# Patient Record
Sex: Female | Born: 1965 | Race: White | Hispanic: Yes | Marital: Single | Smoking: Never smoker
Health system: Southern US, Community
[De-identification: ages and names within clinical notes are randomized; demographics above are authoritative.]

## PROBLEM LIST (undated history)

## (undated) DIAGNOSIS — Z8719 Personal history of other diseases of the digestive system: Secondary | ICD-10-CM

## (undated) DIAGNOSIS — K8050 Calculus of bile duct without cholangitis or cholecystitis without obstruction: Secondary | ICD-10-CM

## (undated) DIAGNOSIS — R600 Localized edema: Secondary | ICD-10-CM

## (undated) HISTORY — PX: OTHER SURGICAL HISTORY: SHX169

## (undated) HISTORY — DX: Calculus of bile duct without cholangitis or cholecystitis without obstruction: K80.50

---

## 2004-11-11 ENCOUNTER — Inpatient Hospital Stay: Payer: Self-pay | Admitting: Obstetrics and Gynecology

## 2005-06-22 ENCOUNTER — Other Ambulatory Visit: Payer: Self-pay

## 2005-06-22 ENCOUNTER — Emergency Department: Payer: Self-pay | Admitting: Emergency Medicine

## 2005-06-25 ENCOUNTER — Ambulatory Visit: Payer: Self-pay | Admitting: Emergency Medicine

## 2006-11-28 ENCOUNTER — Ambulatory Visit (HOSPITAL_COMMUNITY): Admission: RE | Admit: 2006-11-28 | Discharge: 2006-11-28 | Payer: Self-pay | Admitting: Obstetrics & Gynecology

## 2007-01-19 ENCOUNTER — Ambulatory Visit: Payer: Self-pay | Admitting: *Deleted

## 2007-01-19 ENCOUNTER — Inpatient Hospital Stay (HOSPITAL_COMMUNITY): Admission: AD | Admit: 2007-01-19 | Discharge: 2007-01-21 | Payer: Self-pay | Admitting: Obstetrics & Gynecology

## 2008-06-11 IMAGING — US US OB COMP +14 WK
1 series · 14 of 28 positions shown · non-contrast
Comparison: none

OBSTETRICAL ULTRASOUND:

 This ultrasound exam was performed in the [HOSPITAL] Ultrasound Department.  The OB US report was generated in the AS system, and faxed to the ordering physician.  This report is also available in [REDACTED] PACS.

[Series 1: us ob comp +14 wk · 0.30mm/px · 14 of 46 slices shown]
[im 2/46]
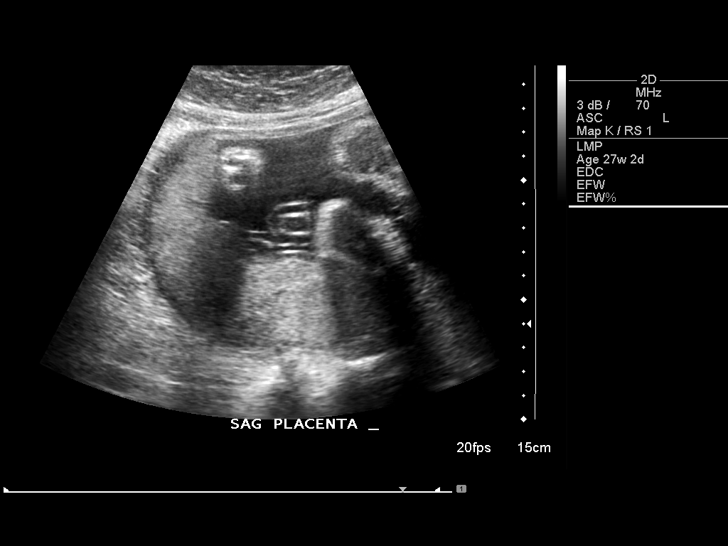
[im 6/46]
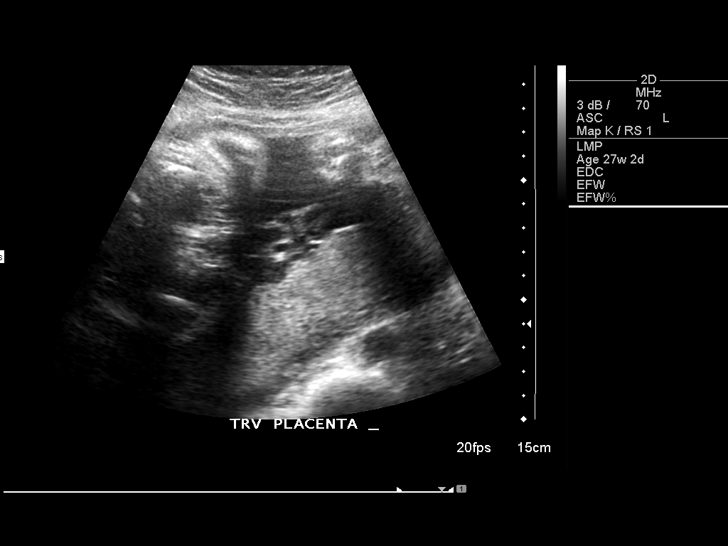
[im 9/46]
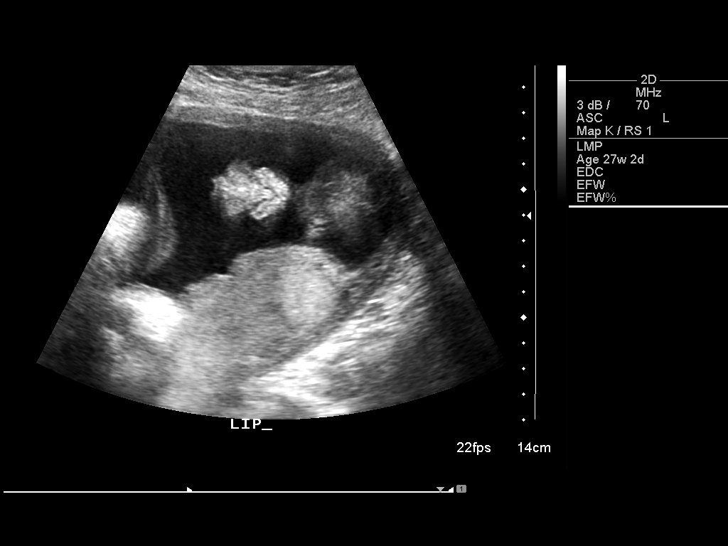
[im 12/46]
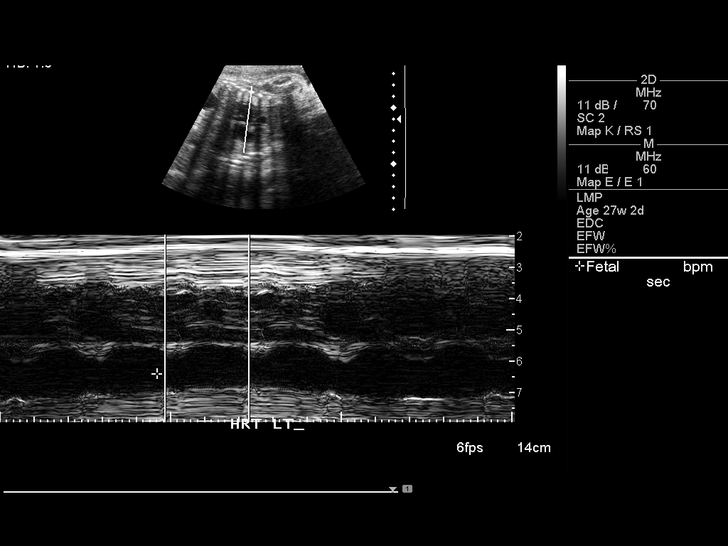
[im 16/46]
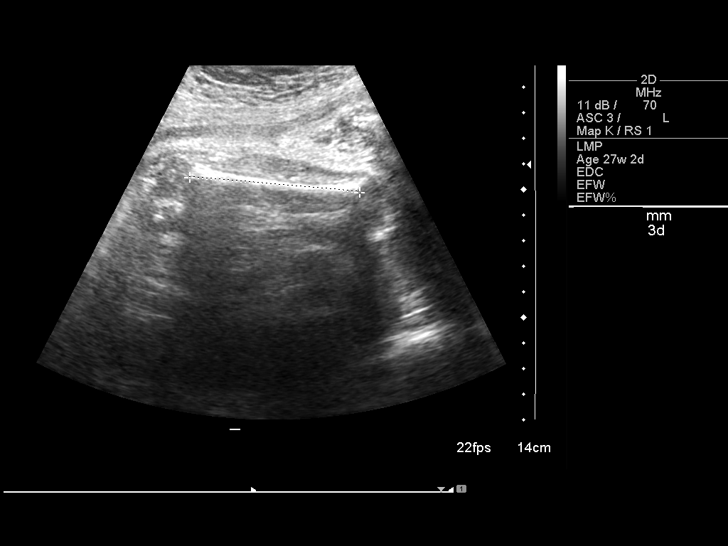
[im 19/46]
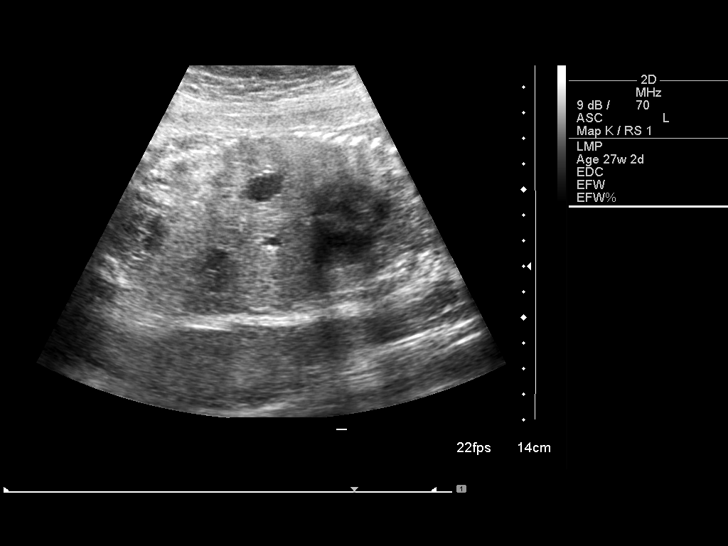
[im 22/46]
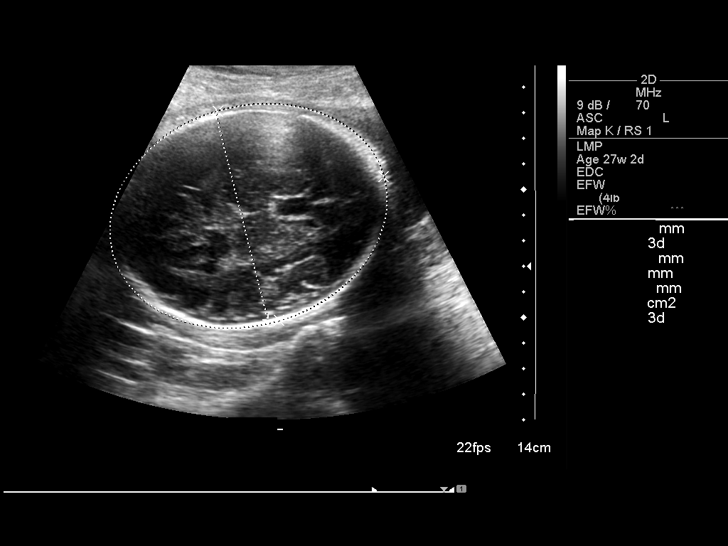
[im 26/46]
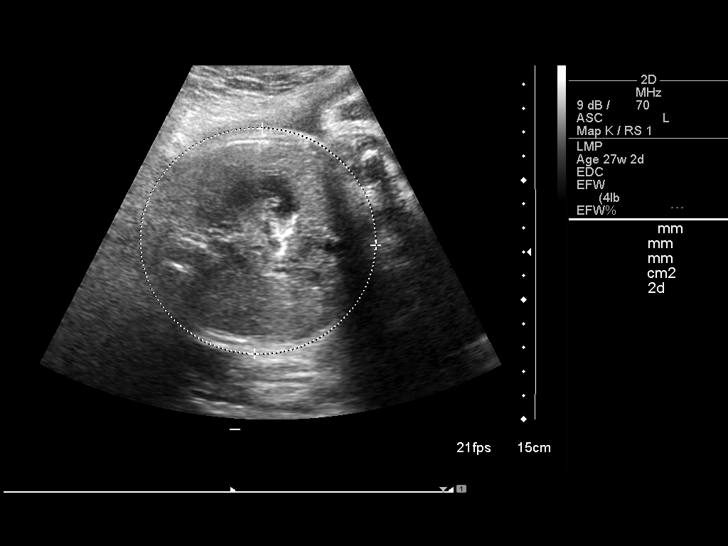
[im 29/46]
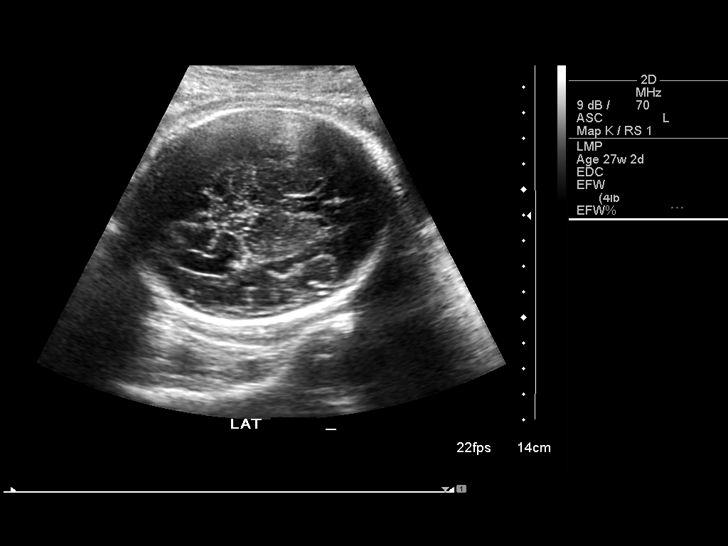
[im 32/46]
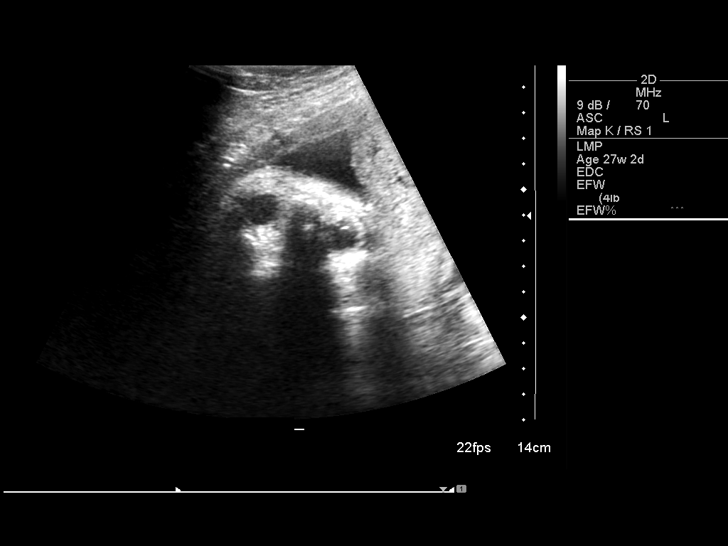
[im 36/46]
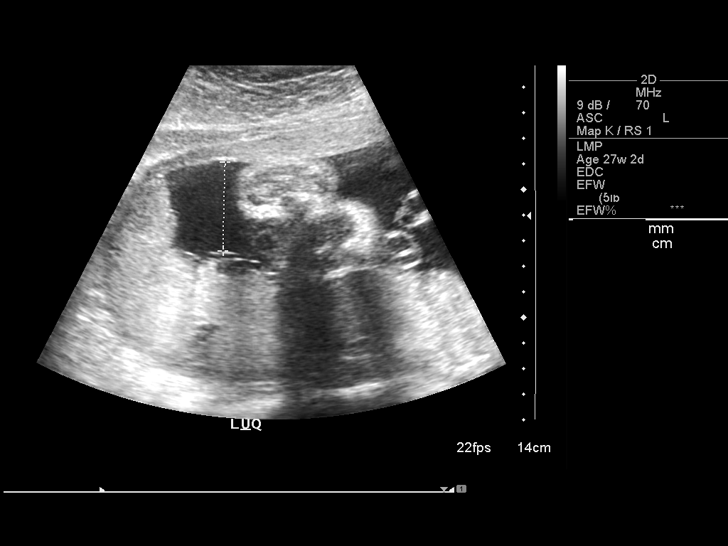
[im 39/46]
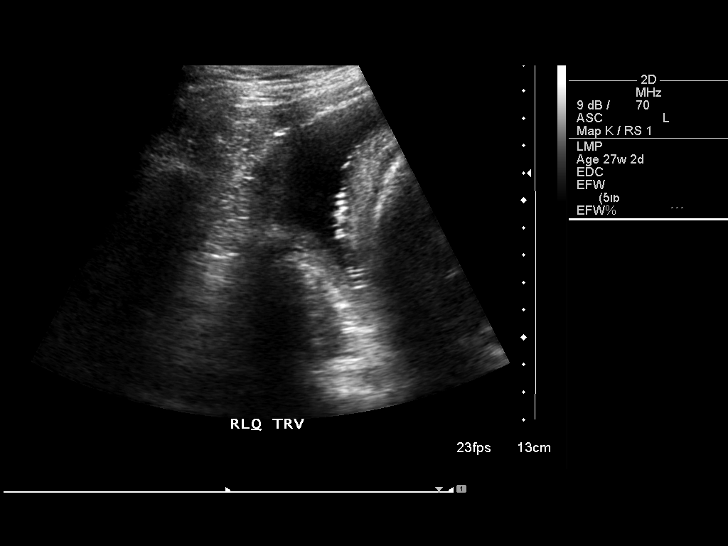
[im 42/46]
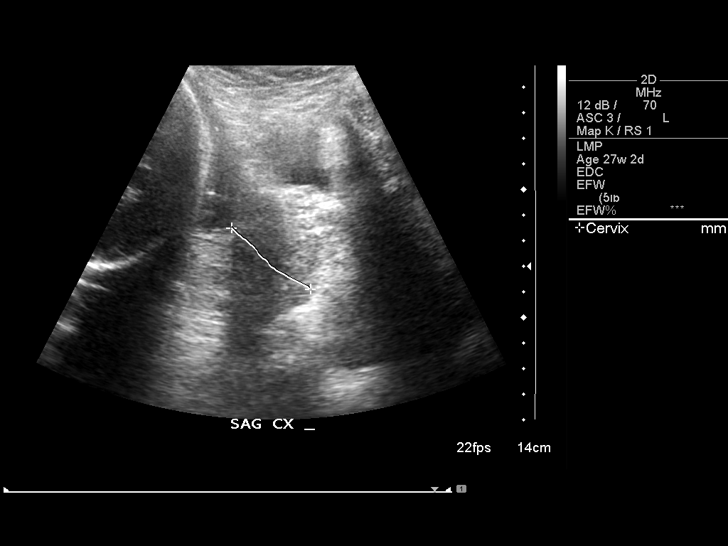
[im 46/46]
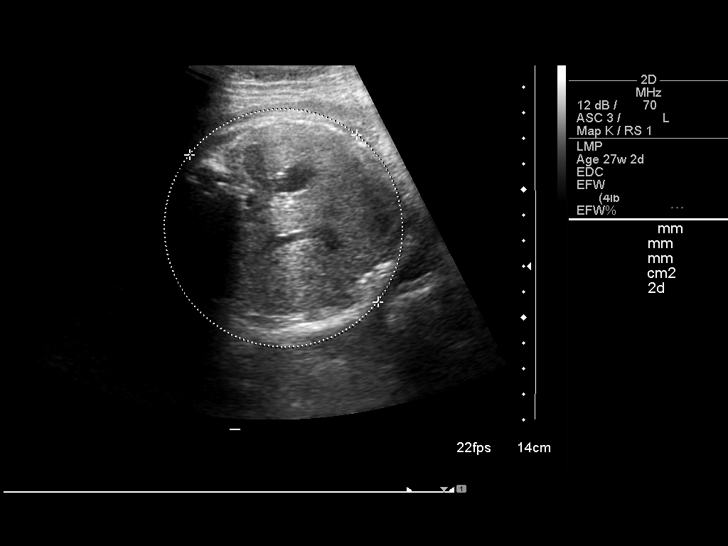

[14 of 28 positions shown; findings below may reference images not displayed]

IMPRESSION: See AS Obstetric US report.

## 2009-11-08 ENCOUNTER — Encounter: Admission: RE | Admit: 2009-11-08 | Discharge: 2009-11-08 | Payer: Self-pay | Admitting: Specialist

## 2011-05-30 LAB — CBC
Hemoglobin: 10.5 — ABNORMAL LOW
MCHC: 33.5
MCV: 90.7
MCV: 91.4
Platelets: 261
RDW: 14.6 — ABNORMAL HIGH
WBC: 10.1

## 2011-05-30 LAB — RPR: RPR Ser Ql: NONREACTIVE

## 2011-07-22 ENCOUNTER — Emergency Department (HOSPITAL_COMMUNITY)
Admission: EM | Admit: 2011-07-22 | Discharge: 2011-07-22 | Disposition: A | Discharge status: Home / Self Care | Payer: Self-pay | Attending: Emergency Medicine | Admitting: Emergency Medicine

## 2011-07-22 ENCOUNTER — Emergency Department (HOSPITAL_COMMUNITY): Payer: Self-pay

## 2011-07-22 ENCOUNTER — Encounter: Payer: Self-pay | Admitting: *Deleted

## 2011-07-22 DIAGNOSIS — R10816 Epigastric abdominal tenderness: Secondary | ICD-10-CM | POA: Insufficient documentation

## 2011-07-22 DIAGNOSIS — R1013 Epigastric pain: Secondary | ICD-10-CM | POA: Insufficient documentation

## 2011-07-22 DIAGNOSIS — K8020 Calculus of gallbladder without cholecystitis without obstruction: Secondary | ICD-10-CM | POA: Insufficient documentation

## 2011-07-22 DIAGNOSIS — K8050 Calculus of bile duct without cholangitis or cholecystitis without obstruction: Secondary | ICD-10-CM

## 2011-07-22 LAB — COMPREHENSIVE METABOLIC PANEL
ALT: 37 U/L — ABNORMAL HIGH (ref 0–35)
AST: 70 U/L — ABNORMAL HIGH (ref 0–37)
Albumin: 3 g/dL — ABNORMAL LOW (ref 3.5–5.2)
Alkaline Phosphatase: 89 U/L (ref 39–117)
BUN: 11 mg/dL (ref 6–23)
Calcium: 9.3 mg/dL (ref 8.4–10.5)
Creatinine, Ser: 0.56 mg/dL (ref 0.50–1.10)
GFR calc Af Amer: >90 mL/min (ref >90)
GFR calc non Af Amer: >90 mL/min (ref >90)
Glucose, Bld: 164 mg/dL — ABNORMAL HIGH (ref 70–99)
Total Protein: 6.9 g/dL (ref 6.0–8.3)

## 2011-07-22 LAB — URINALYSIS, ROUTINE W REFLEX MICROSCOPIC
Glucose, UA: NEGATIVE mg/dL
Ketones, ur: NEGATIVE mg/dL
Nitrite: NEGATIVE
Protein, ur: NEGATIVE mg/dL
Specific Gravity, Urine: 1.025 (ref 1.005–1.030)
Urobilinogen, UA: 1 mg/dL (ref 0.0–1.0)
pH: 5.5 (ref 5.0–8.0)

## 2011-07-22 LAB — DIFFERENTIAL
Neutro Abs: 6.8 10*3/uL (ref 1.7–7.7)
Neutrophils Relative %: 78 % — ABNORMAL HIGH (ref 43–77)

## 2011-07-22 LAB — CBC
MCH: 29.4 pg (ref 26.0–34.0)
Platelets: 210 10*3/uL (ref 150–400)
RBC: 4.15 MIL/uL (ref 3.87–5.11)
RDW: 13.1 % (ref 11.5–15.5)
WBC: 8.7 10*3/uL (ref 4.0–10.5)

## 2011-07-22 LAB — URINE MICROSCOPIC-ADD ON

## 2011-07-22 MED ORDER — ONDANSETRON HCL 4 MG PO TABS: 4.0000 mg | ORAL_TABLET | Freq: Four times a day (QID) | ORAL | Status: AC

## 2011-07-22 MED ORDER — OXYCODONE-ACETAMINOPHEN 5-325 MG PO TABS: 2.0000 | ORAL_TABLET | ORAL | Status: AC | PRN

## 2011-07-22 NOTE — ED Notes (Signed)
Patient transported to Ultrasound 

## 2011-07-22 NOTE — ED Notes (Signed)
Pt UPT is negative. Patrica Duel, MD aware

## 2011-07-22 NOTE — ED Notes (Signed)
Returned from CT.

## 2011-07-22 NOTE — ED Notes (Signed)
No complaints at present. Voices understanding of instructions given. Walked to check out window.  

## 2011-07-22 NOTE — ED Provider Notes (Cosign Needed Addendum)
History     CSN: [id number] Arrival date & time: 07/22/2011 11:25 AM   First MD Initiated Contact with Patient 07/22/11 1236      Chief Complaint  Patient presents with  . Abdominal Pain    pt states acute abd pain that began this am. pt reports vomiting x's 2 in past 24hrs. pt reports bilateral upper quadrant pain.    patient began having sharp epigastric pain. This morning, associated with vomiting. Her husband states this was shortly after eating cereal. She does feel better at this time. She's had no chest pain. No dyspnea. No fever. She does not believe any chance of being pregnant. At this time. No back pain. No numbness, weakness or tingling  (Consider location/radiation/quality/duration/timing/severity/associated sxs/prior treatment) HPI  History reviewed. No pertinent past medical history.  History reviewed. No pertinent past surgical history.  History reviewed. No pertinent family history.  History  Substance Use Topics  . Smoking status: Never Smoker   . Smokeless tobacco: Not on file  . Alcohol Use: No    OB History    Grav Para Term Preterm Abortions TAB SAB Ect Mult Living                  Review of Systems  All other systems reviewed and are negative.    Allergies  Review of patient's allergies indicates no known allergies.  Home Medications  No current outpatient prescriptions on file.  BP 111/64  Pulse 71  Temp(Src) 97.6 F (36.4 C) (Oral)  Resp 16  SpO2 98%  LMP 06/17/2011  Physical Exam  Nursing note and vitals reviewed. Constitutional: She is oriented to person, place, and time. She appears well-developed and well-nourished.  HENT:  Head: Normocephalic and atraumatic.  Eyes: Conjunctivae and EOM are normal. Pupils are equal, round, and reactive to light.  Neck: Neck supple.  Cardiovascular: Normal rate and regular rhythm.  Exam reveals no gallop and no friction rub.   No murmur heard. Pulmonary/Chest: Breath sounds normal. She has  no wheezes. She has no rales. She exhibits no tenderness.  Abdominal: Soft. Bowel sounds are normal. She exhibits no distension. There is tenderness. There is no rebound and no guarding.       Mild diffuse tenderness in the epigastric region to deep palpation. There is no lower quadrant tenderness. No rebound, rigidity or guarding.  Musculoskeletal: Normal range of motion.  Neurological: She is alert and oriented to person, place, and time. No cranial nerve deficit. Coordination normal.  Skin: Skin is warm and dry. No rash noted.  Psychiatric: She has a normal mood and affect.    ED Course  Procedures (including critical care time)  Labs Reviewed  URINALYSIS, ROUTINE W REFLEX MICROSCOPIC - Abnormal; Notable for the following:    Color, Urine AMBER (*) BIOCHEMICALS MAY BE AFFECTED BY COLOR   Hgb urine dipstick SMALL (*)    Bilirubin Urine SMALL (*)    Leukocytes, UA SMALL (*)    All other components within normal limits  URINE MICROSCOPIC-ADD ON - Abnormal; Notable for the following:    Squamous Epithelial / LPF MANY (*)    Bacteria, UA MANY (*)    All other components within normal limits  DIFFERENTIAL - Abnormal; Notable for the following:    Neutrophils Relative 78 (*)    All other components within normal limits  COMPREHENSIVE METABOLIC PANEL - Abnormal; Notable for the following:    Glucose, Bld 164 (*)    Albumin 3.0 (*)  AST 70 (*)    ALT 37 (*)    All other components within normal limits  CBC  LIPASE, BLOOD  POCT PREGNANCY, URINE   US Abdomen Complete  07/22/2011  *RADIOLOGY REPORT*  Clinical Data:  Progressive pain after eating.  COMPLETE ABDOMINAL ULTRASOUND  Comparison:  11/08/2009 ultrasound.  Findings:  Gallbladder:  Contracted gallbladder containing stones. Gallbladder wall thickness difficult to adequately assessed measuring up to 2 mm.  No obvious pericholecystic fluid.  The patient was not tender over this region during scanning per ultrasound technologist.   Common bile duct:  9 mm slightly more prominent than on the prior exam when this measured 8 mm.  The distal aspect of the common bile duct is not visualized secondary to overlying bowel gas and distal bile duct stone not excluded.  Liver:  No focal mass or findings of intrahepatic biliary duct dilation.  IVC:  Appears normal.  Pancreas:  Evaluation limited by bowel gas.  Spleen:  8.8 cm.  No focal mass.  Right Kidney:  9.7 cm. No hydronephrosis or renal mass.  Left Kidney:  10.4 cm.  Suggestion of mild malrotation. No hydronephrosis or renal mass.  Abdominal aorta:  No aneurysm identified.  IMPRESSION: Multiple gallstones within contracted gallbladder.  Common bile duct prominence as noted above.  Difficult to adequately assess the pancreas secondary to overlying bowel gas.  Original Report Authenticated By: Fuller Canada, M.D.     No diagnosis found.    MDM  Pt is seen and examined;  Initial history and physical completed.  Will follow.          Whitfield Dulay A. Patrica Duel, MD 07/22/11 1246  Results for orders placed during the hospital encounter of 07/22/11  URINALYSIS, ROUTINE W REFLEX MICROSCOPIC      Component Value Range   Color, Urine AMBER (*) YELLOW    APPearance CLEAR  CLEAR    Specific Gravity, Urine 1.025  1.005 - 1.030    pH 5.5  5.0 - 8.0    Glucose, UA NEGATIVE  NEGATIVE (mg/dL)   Hgb urine dipstick SMALL (*) NEGATIVE    Bilirubin Urine SMALL (*) NEGATIVE    Ketones, ur NEGATIVE  NEGATIVE (mg/dL)   Protein, ur NEGATIVE  NEGATIVE (mg/dL)   Urobilinogen, UA 1.0  0.0 - 1.0 (mg/dL)   Nitrite NEGATIVE  NEGATIVE    Leukocytes, UA SMALL (*) NEGATIVE   URINE MICROSCOPIC-ADD ON      Component Value Range   Squamous Epithelial / LPF MANY (*) RARE    WBC, UA 3-6  <3 (WBC/hpf)   RBC / HPF 3-6  <3 (RBC/hpf)   Bacteria, UA MANY (*) RARE    Urine-Other MUCOUS PRESENT    CBC      Component Value Range   WBC 8.7  4.0 - 10.5 (K/uL)   RBC 4.15  3.87 - 5.11 (MIL/uL)   Hemoglobin 12.2   12.0 - 15.0 (g/dL)   HCT 16.1  09.6 - 04.5 (%)   MCV 88.9  78.0 - 100.0 (fL)   MCH 29.4  26.0 - 34.0 (pg)   MCHC 33.1  30.0 - 36.0 (g/dL)   RDW 40.9  81.1 - 91.4 (%)   Platelets 210  150 - 400 (K/uL)  DIFFERENTIAL      Component Value Range   Neutrophils Relative 78 (*) 43 - 77 (%)   Neutro Abs 6.8  1.7 - 7.7 (K/uL)   Lymphocytes Relative 16  12 - 46 (%)   Lymphs  Abs 1.4  0.7 - 4.0 (K/uL)   Monocytes Relative 6  3 - 12 (%)   Monocytes Absolute 0.5  0.1 - 1.0 (K/uL)   Eosinophils Relative 1  0 - 5 (%)   Eosinophils Absolute 0.1  0.0 - 0.7 (K/uL)   Basophils Relative 0  0 - 1 (%)   Basophils Absolute 0.0  0.0 - 0.1 (K/uL)  COMPREHENSIVE METABOLIC PANEL      Component Value Range   Sodium 136  135 - 145 (mEq/L)   Potassium 3.7  3.5 - 5.1 (mEq/L)   Chloride 104  96 - 112 (mEq/L)   CO2 23  19 - 32 (mEq/L)   Glucose, Bld 164 (*) 70 - 99 (mg/dL)   BUN 11  6 - 23 (mg/dL)   Creatinine, Ser 2.95  0.50 - 1.10 (mg/dL)   Calcium 9.3  8.4 - 62.1 (mg/dL)   Total Protein 6.9  6.0 - 8.3 (g/dL)   Albumin 3.0 (*) 3.5 - 5.2 (g/dL)   AST 70 (*) 0 - 37 (U/L)   ALT 37 (*) 0 - 35 (U/L)   Alkaline Phosphatase 89  39 - 117 (U/L)   Total Bilirubin 0.4  0.3 - 1.2 (mg/dL)   GFR calc non Af Amer >90  >90 (mL/min)   GFR calc Af Amer >90  >90 (mL/min)  LIPASE, BLOOD      Component Value Range   Lipase 25  11 - 59 (U/L)   No results found.   Urine preg test NEGATIVE,  not crossing over. Symptoms likely secondary to biliary colic and will be amenable to outpatient followup.   Sherial Ebrahim A. Patrica Duel, MD 07/22/11 1448   The patient will be discharged home in stable and improved condition. The patient understands discharge instructions and the importance of close followup until resolution of symptoms. The patient understands to return to the emergency apartment and any time for any concerning symptoms.   Jon Kasparek A. Patrica Duel, MD 07/22/11 [phone]

## 2011-07-22 NOTE — ED Notes (Signed)
Pt presenting to ed with c/o abdominal pain and nausea and vomiting onset this am. Pt is alert and oriented spanish speaking female with family interpreting at bedside

## 2013-01-18 ENCOUNTER — Ambulatory Visit: Payer: Self-pay

## 2013-01-29 ENCOUNTER — Emergency Department (HOSPITAL_COMMUNITY)
Admission: EM | Admit: 2013-01-29 | Discharge: 2013-01-29 | Disposition: A | Discharge status: Home / Self Care | Payer: No Typology Code available for payment source | Attending: Emergency Medicine | Admitting: Emergency Medicine

## 2013-01-29 ENCOUNTER — Encounter: Payer: Self-pay | Admitting: Internal Medicine

## 2013-01-29 ENCOUNTER — Ambulatory Visit: Payer: No Typology Code available for payment source | Attending: Family Medicine | Admitting: Internal Medicine

## 2013-01-29 ENCOUNTER — Encounter (HOSPITAL_COMMUNITY): Payer: Self-pay | Admitting: Emergency Medicine

## 2013-01-29 ENCOUNTER — Emergency Department (HOSPITAL_COMMUNITY): Payer: No Typology Code available for payment source

## 2013-01-29 DIAGNOSIS — I10 Essential (primary) hypertension: Secondary | ICD-10-CM | POA: Insufficient documentation

## 2013-01-29 DIAGNOSIS — K8020 Calculus of gallbladder without cholecystitis without obstruction: Secondary | ICD-10-CM | POA: Insufficient documentation

## 2013-01-29 DIAGNOSIS — Z79899 Other long term (current) drug therapy: Secondary | ICD-10-CM | POA: Insufficient documentation

## 2013-01-29 DIAGNOSIS — R110 Nausea: Secondary | ICD-10-CM | POA: Insufficient documentation

## 2013-01-29 LAB — CBC WITH DIFFERENTIAL/PLATELET
HCT: 38.8 % (ref 36.0–46.0)
Hemoglobin: 13.4 g/dL (ref 12.0–15.0)
Lymphocytes Relative: 37 % (ref 12–46)
MCH: 31.2 pg (ref 26.0–34.0)
MCHC: 34.5 g/dL (ref 30.0–36.0)
MCV: 90.2 fL (ref 78.0–100.0)
Monocytes Absolute: 0.4 10*3/uL (ref 0.1–1.0)
Monocytes Relative: 6 % (ref 3–12)
WBC: 6.6 10*3/uL (ref 4.0–10.5)

## 2013-01-29 LAB — COMPREHENSIVE METABOLIC PANEL
ALT: 38 U/L — ABNORMAL HIGH (ref 0–35)
Albumin: 3.5 g/dL (ref 3.5–5.2)
Chloride: 105 mEq/L (ref 96–112)
Potassium: 4 mEq/L (ref 3.5–5.1)

## 2013-01-29 LAB — POCT I-STAT TROPONIN I: Troponin i, poc: 0 ng/mL (ref 0.00–0.08)

## 2013-01-29 LAB — LIPASE, BLOOD: Lipase: 30 U/L (ref 11–59)

## 2013-01-29 MED ORDER — PROMETHAZINE HCL 25 MG PO TABS: 25.0000 mg | ORAL_TABLET | Freq: Four times a day (QID) | ORAL | Status: DC | PRN

## 2013-01-29 MED ORDER — HYDROCODONE-ACETAMINOPHEN 5-325 MG PO TABS: 1.0000 | ORAL_TABLET | Freq: Four times a day (QID) | ORAL | Status: DC | PRN

## 2013-01-29 MED ORDER — ONDANSETRON HCL 4 MG/2ML IJ SOLN
4.0000 mg | Freq: Once | INTRAMUSCULAR | Status: AC
  Administered 2013-01-29: 4 mg via INTRAVENOUS
  Filled 2013-01-29: qty 2

## 2013-01-29 MED ORDER — MORPHINE SULFATE 4 MG/ML IJ SOLN
4.0000 mg | Freq: Once | INTRAMUSCULAR | Status: AC
  Administered 2013-01-29: 4 mg via INTRAVENOUS
  Filled 2013-01-29: qty 1

## 2013-01-29 NOTE — Progress Notes (Unsigned)
01/29/13 Present to clinic with abdominal pain   x 3-4 days.Rate pain 7 on scale . Also c/o of bloating [facility]

## 2013-01-29 NOTE — ED Notes (Signed)
Pt. Stated, stomach pain 4 days.  Limited [place]

## 2013-01-29 NOTE — ED Provider Notes (Signed)
History     CSN: [id number]  Arrival date & time 01/29/13  1145   First MD Initiated Contact with Patient 01/29/13 1205      Chief Complaint  Patient presents with  . Abdominal Pain    (Consider location/radiation/quality/duration/timing/severity/associated sxs/prior treatment) HPI Comments: Patient presents with a chief complaint of epigastric and RUQ abdominal pain.  Pain radiates to the back.  Pain has been present for the past 4-5 days.  Pain is intermittent.   Pain becomes worse after eating.  Pain associated with some nausea, but no vomiting.  She denies diarrhea.  Denies fever or chills.  Review of the chart shows that the patient had an Abdominal Ultrasound done in 2012, which showed multiple Gallstones.    Patient is a 47 y.o. female presenting with abdominal pain. The history is provided by the patient. The history is limited by a language barrier. Language interpreter used: [employer] phone interpretor used.  Abdominal Pain The problem has been gradually worsening. Associated symptoms include abdominal pain and nausea. Pertinent negatives include no fever, urinary symptoms or vomiting. Exacerbated by: deep breaths. She has tried nothing for the symptoms.    Past Medical History  Diagnosis Date  . Hypertension     Past Surgical History  Procedure Laterality Date  . Cardiac valve replacement      No family history on file.  History  Substance Use Topics  . Smoking status: Never Smoker   . Smokeless tobacco: Not on file  . Alcohol Use: No    OB History   Grav Para Term Preterm Abortions TAB SAB Ect Mult Living                  Review of Systems  Constitutional: Negative for fever.  Gastrointestinal: Positive for nausea and abdominal pain. Negative for vomiting.  All other systems reviewed and are negative.    Allergies  Review of patient's allergies indicates no known allergies.  Home Medications   Current Outpatient Rx  Name  Route  Sig  Dispense   Refill  . lisinopril-hydrochlorothiazide (PRINZIDE,ZESTORETIC) 10-12.5 MG per tablet   Oral   Take 1 tablet by mouth daily.           BP 150/63  Pulse 69  Temp(Src) 98 F (36.7 C) (Oral)  Resp 16  SpO2 99%  LMP 01/19/2013  Physical Exam  Nursing note and vitals reviewed. Constitutional: She appears well-developed and well-nourished. No distress.  HENT:  Head: Normocephalic and atraumatic.  Mouth/Throat: Oropharynx is clear and moist.  Neck: Normal range of motion. Neck supple.  Cardiovascular: Normal rate, regular rhythm and normal heart sounds.   Pulmonary/Chest: Effort normal and breath sounds normal.  Abdominal: Soft. Normal appearance and bowel sounds are normal. She exhibits no distension and no mass. There is tenderness in the right upper quadrant and epigastric area. There is no rebound and no guarding.  Neurological: She is alert.  Skin: Skin is warm and dry. She is not diaphoretic.  Psychiatric: She has a normal mood and affect.    ED Course  Procedures (including critical care time)  Labs Reviewed  CBC WITH DIFFERENTIAL  COMPREHENSIVE METABOLIC PANEL  LIPASE, BLOOD   US Abdomen Complete  01/29/2013   *RADIOLOGY REPORT*  Clinical Data:  Right upper quadrant pain  ABDOMINAL ULTRASOUND COMPLETE  Comparison:  07/22/2011  Findings:  Gallbladder:  There is evidence of a wall echo which pad sign within the gallbladder consistent with diffuse cholelithiasis.  No significant wall thickening  or pericholecystic fluid is noted.  A negative sonographic Murphy's sign is noted.  These changes are similar to that seen on the prior exam.  Common Bile Duct:  3 mm.  Liver: No focal mass lesion identified.  Within normal limits in parenchymal echogenicity.  IVC:  Appears normal.  Pancreas:  No abnormality identified.  Spleen:  Within normal limits in size and echotexture.  Right kidney:  9.4 cm in length.  No mass lesion or hydronephrosis is noted.  Left kidney:  10 cm in length.  No  evidence of mass or hydronephrosis.  Abdominal Aorta:  No aneurysm identified.  IMPRESSION: Diffuse cholelithiasis similar to that seen on the prior exam.  No acute abnormality is noted.   Original Report Authenticated By: Alcide Clever, M.D.     No diagnosis found.    Date: 01/29/2013  Rate: 72  Rhythm: normal sinus rhythm  QRS Axis: normal  Intervals: normal  ST/T Wave abnormalities: normal  Conduction Disutrbances:none  Narrative Interpretation:   Old EKG Reviewed: none available    MDM  Patient presents with RUQ and epigastric abdominal pain that is associated with nausea.  Labs unremarkable.  Abdominal Ultrasound shows Cholelithiasis, but no Cholecystitis.  Pain and nausea controlled prior to discharge.  Patient stable for discharge.  Patient given referral to GI.  Patient discharged home with pain medication and Phenergan for nausea.        Pascal Lux [place], PA-C 01/29/13 1746

## 2013-01-29 NOTE — ED Notes (Signed)
Ultrasound not complete at this time.   I did call us to ask when patient would get test.   She is next to go.  Patient advised.

## 2013-01-29 NOTE — Progress Notes (Unsigned)
Patient ID: Jasmine Harvey, female   DOB: [birth year], 47 y.o.   MRN: [id number]   CC:  HPI:  47 year old female who presents to the ER for abdominal pain. The patient stated the pain has been worse for the last 5 days. She describes the pain as being in the right upper quadrant radiating to the epigastric region. She denies any nausea vomiting. She has noticed some constipation over the last few days and associated with abdominal bloating. She states that she has a remote history of gallstones. Of note is that she recently started taking her antihypertensive medication 2 days ago and she has taken 2 pills in the pain has preceded her starting her antihypertensive medication.   No Known Allergies Past Medical History  Diagnosis Date  . Hypertension    No current outpatient prescriptions on file prior to visit.   No current facility-administered medications on file prior to visit.   History reviewed. No pertinent family history. History   Social History  . Marital Status: Single    Spouse Name: N/A    Number of Children: N/A  . Years of Education: N/A   Occupational History  . Not on file.   Social History Main Topics  . Smoking status: Never Smoker   . Smokeless tobacco: Not on file  . Alcohol Use: No  . Drug Use: No  . Sexually Active: Yes   Other Topics Concern  . Not on file   Social History Narrative  . No narrative on file    Review of Systems  Constitutional: Negative for fever, chills, diaphoresis, activity change, appetite change and fatigue.  HENT: Negative for ear pain, nosebleeds, congestion, facial swelling, rhinorrhea, neck pain, neck stiffness and ear discharge.   Eyes: Negative for pain, discharge, redness, itching and visual disturbance.  Respiratory: Negative for cough, choking, chest tightness, shortness of breath, wheezing and stridor.   Cardiovascular: Negative for chest pain, palpitations and leg swelling.  Gastrointestinal: Negative for  abdominal distention.  Genitourinary: Negative for dysuria, urgency, frequency, hematuria, flank pain, decreased urine volume, difficulty urinating and dyspareunia.  Musculoskeletal: Negative for back pain, joint swelling, arthralgias and gait problem.  Neurological: Negative for dizziness, tremors, seizures, syncope, facial asymmetry, speech difficulty, weakness, light-headedness, numbness and headaches.  Hematological: Negative for adenopathy. Does not bruise/bleed easily.  Psychiatric/Behavioral: Negative for hallucinations, behavioral problems, confusion, dysphoric mood, decreased concentration and agitation.    Objective:   Filed Vitals:   01/29/13 1057  BP: 138/83  Pulse: 72  Temp: 97.9 F (36.6 C)  Resp: 16    Physical Exam  Constitutional: Appears well-developed and well-nourished. No distress.  HENT: Normocephalic. External right and left ear normal. Oropharynx is clear and moist.  Eyes: Conjunctivae and EOM are normal. PERRLA, no scleral icterus.  Neck: Normal ROM. Neck supple. No JVD. No tracheal deviation. No thyromegaly.  CVS: RRR, S1/S2 +, no murmurs, no gallops, no carotid bruit.  Pulmonary: Effort and breath sounds normal, no stridor, rhonchi, wheezes, rales.  Abdominal: Soft. BS +,  no distension, tenderness, rebound or guarding.  Musculoskeletal: Normal range of motion. No edema and no tenderness.  Lymphadenopathy: No lymphadenopathy noted, cervical, inguinal. Neuro: Alert. Normal reflexes, muscle tone coordination. No cranial nerve deficit. Skin: Skin is warm and dry. No rash noted. Not diaphoretic. No erythema. No pallor.  Psychiatric: Normal mood and affect. Behavior, judgment, thought content normal.   Lab Results  Component Value Date   WBC 8.7 07/22/2011   HGB 12.2 07/22/2011   HCT 36.9  07/22/2011   MCV 88.9 07/22/2011   PLT 210 07/22/2011   Lab Results  Component Value Date   CREATININE 0.56 07/22/2011   BUN 11 07/22/2011   NA 136 07/22/2011   K  3.7 07/22/2011   CL 104 07/22/2011   CO2 23 07/22/2011    No results found for this basename: HGBA1C   Lipid Panel  No results found for this basename: chol, trig, hdl, cholhdl, vldl, ldlcalc       Assessment and plan:   There are no active problems to display for this patient.  Abdominal pain Patient complains of pain severe enough to wake her up at night The pain has lasted 5 days Given the severity of her pain the patient is being referred to the ER for a full workup including a CT scan, CMP panel, lipase, CBC, Arrange for followup in a week

## 2013-01-29 NOTE — ED Provider Notes (Signed)
  Medical screening examination/treatment/procedure(s) were performed by non-physician practitioner and as supervising physician I was immediately available for consultation/collaboration.        Gerhard Munch, MD 01/29/13 [phone]

## 2013-01-29 NOTE — Progress Notes (Unsigned)
Pt transfer to [facility] via shuttle in stable condition.vss. Report given to [facility].

## 2013-02-01 ENCOUNTER — Telehealth (HOSPITAL_COMMUNITY): Payer: Self-pay | Admitting: Emergency Medicine

## 2013-02-04 ENCOUNTER — Ambulatory Visit: Payer: No Typology Code available for payment source | Attending: Family Medicine | Admitting: Internal Medicine

## 2013-02-04 NOTE — Progress Notes (Unsigned)
Pt here to check on authorization status for gallbladder surgery.

## 2013-02-04 NOTE — Progress Notes (Unsigned)
Entered in error

## 2013-02-04 NOTE — Progress Notes (Unsigned)
Called Jasmine Harvey for authorization and left message. Informed pt i will call when i speak with her

## 2013-02-08 ENCOUNTER — Telehealth: Payer: Self-pay | Admitting: Emergency Medicine

## 2014-02-08 ENCOUNTER — Other Ambulatory Visit (HOSPITAL_COMMUNITY): Payer: Self-pay | Admitting: Nurse Practitioner

## 2014-02-08 DIAGNOSIS — Z1231 Encounter for screening mammogram for malignant neoplasm of breast: Secondary | ICD-10-CM

## 2014-02-23 ENCOUNTER — Ambulatory Visit (HOSPITAL_COMMUNITY): Admission: RE | Admit: 2014-02-23 | Payer: Self-pay | Source: Ambulatory Visit

## 2014-03-16 ENCOUNTER — Ambulatory Visit (HOSPITAL_COMMUNITY)
Admission: RE | Admit: 2014-03-16 | Discharge: 2014-03-16 | Disposition: A | Discharge status: Home / Self Care | Payer: Self-pay | Source: Ambulatory Visit | Attending: Nurse Practitioner | Admitting: Nurse Practitioner

## 2014-03-16 DIAGNOSIS — Z1231 Encounter for screening mammogram for malignant neoplasm of breast: Secondary | ICD-10-CM

## 2014-03-17 ENCOUNTER — Other Ambulatory Visit: Payer: Self-pay | Admitting: Nurse Practitioner

## 2014-03-17 DIAGNOSIS — R928 Other abnormal and inconclusive findings on diagnostic imaging of breast: Secondary | ICD-10-CM

## 2014-03-22 ENCOUNTER — Encounter (HOSPITAL_COMMUNITY): Payer: Self-pay

## 2014-03-22 ENCOUNTER — Ambulatory Visit (HOSPITAL_COMMUNITY)
Admission: RE | Admit: 2014-03-22 | Discharge: 2014-03-22 | Disposition: A | Discharge status: Home / Self Care | Payer: Self-pay | Source: Ambulatory Visit | Attending: Obstetrics and Gynecology | Admitting: Obstetrics and Gynecology

## 2014-03-22 VITALS — BP 118/76 | Temp 97.9°F | Ht 62.0 in | Wt 168.8 lb

## 2014-03-22 DIAGNOSIS — Z1239 Encounter for other screening for malignant neoplasm of breast: Secondary | ICD-10-CM

## 2014-03-22 NOTE — Progress Notes (Signed)
No complaints today. [facility] due to recommending additional imaging of the right breast. Screening mamm[facility]/2014 [facility].  Pap Smear:  Pap smear not completed today. [facility] and normal per patient. Per patient has no history of an abnormal Pap smear. No Pap smear results in EPIC.  Physical exam: Breasts Breasts symmetrical. No skin abnormalities bilateral breasts. No nipple retraction bilateral breasts. No nipple discharge bilateral breasts. No lymphadenopathy. No lumps palpated bilateral breasts. No complaints of pain or tenderness on exam. [facility] for right breast diagnostic mammogram per recommendation. Appointment scheduled for Thursday, March 31, 2014 at 1030.     Pelvic/Bimanual No Pap smear completed today since last Pap smear was 01/26/2014. Pap smear not indicated per BCCCP guidelines.

## 2014-03-22 NOTE — Patient Instructions (Signed)
Explained to Jasmine Harvey that she did not need a Pap smear today due to last Pap smear was 01/26/2014. Let her know [facility] will cover Pap smears every 3 years unless has a history of abnormal Pap smears. [facility] for right breast diagnostic mammogram per recommendation. Appointment scheduled for Thursday, March 31, 2014 at 1030. Patient aware of appointment and will be there.  Jasmine Harvey verbalized understanding.  Jayvion Stefanski, Kathaleen Maserhristine Poll, RN 10:23 AM

## 2014-03-25 ENCOUNTER — Ambulatory Visit (HOSPITAL_BASED_OUTPATIENT_CLINIC_OR_DEPARTMENT_OTHER): Payer: Self-pay

## 2014-03-25 ENCOUNTER — Other Ambulatory Visit: Payer: Self-pay

## 2014-03-25 VITALS — BP 120/98 | HR 68 | Temp 98.7°F | Resp 14 | Ht 61.75 in | Wt 165.8 lb

## 2014-03-25 DIAGNOSIS — Z0000 Encounter for general adult medical examination without abnormal findings: Secondary | ICD-10-CM

## 2014-03-25 LAB — LIPID PANEL
Cholesterol: 183 mg/dL (ref 0–200)
HDL: 53 mg/dL (ref >39)
LDL CALC: 97 mg/dL (ref 0–99)
TRIGLYCERIDES: 163 mg/dL — AB (ref <150)
Total CHOL/HDL Ratio: 3.5 Ratio
VLDL: 33 mg/dL (ref 0–40)

## 2014-03-25 LAB — GLUCOSE (CC13): Glucose: 90 mg/dl (ref 70–140)

## 2014-03-25 LAB — HEMOGLOBIN A1C
Hgb A1c MFr Bld: 5.7 % — ABNORMAL HIGH (ref <5.7)
Mean Plasma Glucose: 117 mg/dL — ABNORMAL HIGH (ref <117)

## 2014-03-25 NOTE — Progress Notes (Signed)
Patient is a new patient to the [facility] program and is currently a [facility] patient effective 03/22/2014 .  Clinical Measurements: Patient is 5 ft. 1 3/4/inches, weight 165.8 lbs, waist circumference 39 inches, and hip circumference 40.5inches.   Medical History: Patient has no history of high cholesterol. Patient does not have a history of hypertension or diabetes. Per patient no diagnosed history of coronary heart disease, heart attack, heart failure, stroke/TIA, vascular disease or congenital heart defects.   Blood Pressure, Self-measurement: Patient states has no reason to check Blood pressure.  Nutrition Assessment: Patient stated that eats 2 fruits every day. Patient states she eats one servings of vegetables a day.Per patient does not eat 3 or more ounces of whole grains daily. Patient doesn't eat two or more servings of fish weekly.  Patient states she does not drink more than 36 ounces or 450 calories of beverages with added sugars weekly. Patient states she drinks a lot of water. Patient stated she does  watch her salt intake.   Physical Activity Assessment: Per patient does around 240  Moderate exercise and no vigorous exercise.                                                                                                                                                                                                                                                                                                                                                                                                             Smoking Status: Patient had never smoked and is not around smoke.  Quality  of Life Assessment: In assessing patient's quality of life she stated that out of the past 30 days that she has felt her health is good all of them. Patient also stated that in the past 30 days that her mental health is not good including stress, depression and problems with emotions for 21 days. Patient  did state that out of the past 30 days she felt her physical or mental health had not kept her from doing her usual activities including self-care, work or recreation.   Plan: Lab work will be done today including a lipid panel, blood glucose, and Hgb A1C. Will call lab results when they are finished. Patient will return for health coaching using [employer].

## 2014-03-25 NOTE — Patient Instructions (Signed)
Discussed health assessment with patient. Will get doctor appointment because of gallstones. Decrease fat and grease in diet. She will be called with results and appointment will be made for health coaching. Patient verbalized understanding.

## 2014-03-28 ENCOUNTER — Telehealth: Payer: Self-pay

## 2014-03-28 NOTE — Telephone Encounter (Signed)
Called to inform about lab work from 03/25/14. Interpreter informed patient: cholesterol- 183, HDL- 53, LDL- 97, triglycerides - 163, Bld Glucose -117 and HBG-A1C - 5.7.  Set up health Coaching for August 27 at 10 AM at cancer center for nutrition.

## 2014-03-28 NOTE — Telephone Encounter (Signed)
Called [facility] and left message and Beth Mulberry,MD returned the call. Doctor is out of town.  PLAN: E-mail Dr. Delrae AlfredMulberry the problem ,so can schedule follow up concerning blood pressure ,GB problems, A1C and triglycerides. Will call if do not hear back from doctor by Thursday.

## 2014-03-31 ENCOUNTER — Ambulatory Visit
Admission: RE | Admit: 2014-03-31 | Discharge: 2014-03-31 | Disposition: A | Discharge status: Home / Self Care | Payer: No Typology Code available for payment source | Source: Ambulatory Visit | Attending: Nurse Practitioner | Admitting: Nurse Practitioner

## 2014-03-31 DIAGNOSIS — R928 Other abnormal and inconclusive findings on diagnostic imaging of breast: Secondary | ICD-10-CM

## 2014-04-07 ENCOUNTER — Ambulatory Visit: Payer: Self-pay

## 2014-09-05 ENCOUNTER — Other Ambulatory Visit: Payer: Self-pay | Admitting: Nurse Practitioner

## 2014-09-05 DIAGNOSIS — R921 Mammographic calcification found on diagnostic imaging of breast: Secondary | ICD-10-CM

## 2014-09-14 ENCOUNTER — Ambulatory Visit
Admission: RE | Admit: 2014-09-14 | Discharge: 2014-09-14 | Disposition: A | Discharge status: Home / Self Care | Payer: No Typology Code available for payment source | Source: Ambulatory Visit | Attending: Nurse Practitioner | Admitting: Nurse Practitioner

## 2014-09-14 DIAGNOSIS — R921 Mammographic calcification found on diagnostic imaging of breast: Secondary | ICD-10-CM

## 2014-12-30 ENCOUNTER — Telehealth: Payer: Self-pay

## 2014-12-30 NOTE — Telephone Encounter (Signed)
Patient called per interpreter Jasmine Harvey for follow up on hypertension, gallstone, and not having come to health coaching when stated that wanted to come. Patient was a no show for health coach appointment on 8/28. Dr. Delrae AlfredMulberry had called me concerning not being able to reach patient concerning HTN and gallbladder problems. Patient stated that feels about the same as she did when came [facility] August. Patient per interpreter is not following questions very well and feels patient is depressed.Patient stated that had a goal and wanted some assistance. Scheduled for further health coaching on May 27th at 8:30 at cancer center. Interpreter will call to remind patient of appointment and asked patient if could not make to please call.

## 2014-12-30 NOTE — Telephone Encounter (Addendum)
Patient called per interpreter Jasmine RoyalsJulie Harvey. Patient has never been in for health coaching for abnormal labs or follow up since did not go to doctor for blood pressure. Plan to come in for health coaching and re check blood pressure.

## 2015-01-06 ENCOUNTER — Other Ambulatory Visit: Payer: Self-pay

## 2015-01-06 ENCOUNTER — Ambulatory Visit: Payer: Self-pay

## 2015-01-06 VITALS — BP 120/90 | Ht 61.75 in | Wt 171.0 lb

## 2015-01-06 DIAGNOSIS — Z789 Other specified health status: Secondary | ICD-10-CM

## 2015-01-06 NOTE — Patient Instructions (Signed)
Patient will follow 1200 calorie meal plan. Will review all handouts and exercise/activity book. Will increase exercise. Will measure portion sizes. Will call if has any questions. Will call patient in three weeks. Patient verbalized understanding.

## 2015-01-06 NOTE — Progress Notes (Signed)
Patient returns today for Health Coaching regarding Nutrition with daughter for her borderline AIC, triglycerides, ? hypertension and activity with interpreter Delorise RoyalsJulie Sowell.   CLINICAL MEASUREMENTS: Height: 5 ft 1 3/4 inches, Weight: 171, BMI: 31.6.  NUTRITION: Patient and I went over Know Your Health Numbers again.Patient reviewed A1C handout to see about prediabetes, normal and  diabetes range. Patient went over what prediabetes is, complications that can result if do not get levels to normal, and stay at diabetes level. Discussed that needed to lose at least 10% of body weight (17 lbs).Discussed increasing fiber in diet, reading labels, increasing activity and serving sizes.Explained to patient and showed her that she is overweight and has BMI of 30.6.  Discussed watching carbohydrates, how to count them, and serving sizes .Patient reviewed weight /activity chart. Patient went over 1,200 cal meal plan and we broke it down to the number of servings she could have. Patient received and reviewed the following handouts in Spanish: Carb counting Menu, prediabetes, A1C Exam, 1200 calorie meal plan, Serving Portions, and Carb Counting and meal planning. Gave patient measuring cup to measure serving sizes and demonstrated the serving sizes.   ACTIVITY: Discussed activity and walking. Discussed trying and walk 3 days a week for 30 minutes and reviewed [place] Activity book in [place].  PLAN: Pursue orange card for possible gallbladder surgery. Increasing amount of walking per day and add other exercises to plan, Lose weight.  Will Call in couple of weeks and recscreen in the fall.

## 2015-04-18 ENCOUNTER — Other Ambulatory Visit (HOSPITAL_COMMUNITY): Payer: Self-pay | Admitting: *Deleted

## 2015-04-18 DIAGNOSIS — R921 Mammographic calcification found on diagnostic imaging of breast: Secondary | ICD-10-CM

## 2015-04-20 ENCOUNTER — Ambulatory Visit (HOSPITAL_COMMUNITY): Payer: Self-pay

## 2015-05-02 ENCOUNTER — Ambulatory Visit: Payer: Self-pay

## 2015-05-02 ENCOUNTER — Ambulatory Visit: Payer: Self-pay | Attending: Internal Medicine

## 2015-05-16 ENCOUNTER — Ambulatory Visit: Payer: Self-pay | Attending: Internal Medicine

## 2015-05-18 ENCOUNTER — Ambulatory Visit: Payer: Self-pay

## 2015-05-18 ENCOUNTER — Ambulatory Visit (HOSPITAL_COMMUNITY)
Admission: RE | Admit: 2015-05-18 | Discharge: 2015-05-18 | Disposition: A | Discharge status: Home / Self Care | Payer: Self-pay | Source: Ambulatory Visit | Attending: Obstetrics and Gynecology | Admitting: Obstetrics and Gynecology

## 2015-05-18 ENCOUNTER — Encounter (HOSPITAL_COMMUNITY): Payer: Self-pay

## 2015-05-18 VITALS — BP 108/62 | Temp 97.9°F | Ht 62.0 in | Wt 167.0 lb

## 2015-05-18 DIAGNOSIS — Z1239 Encounter for other screening for malignant neoplasm of breast: Secondary | ICD-10-CM

## 2015-05-18 DIAGNOSIS — N6325 Unspecified lump in the left breast, overlapping quadrants: Secondary | ICD-10-CM

## 2015-05-18 DIAGNOSIS — N6311 Unspecified lump in the right breast, upper outer quadrant: Secondary | ICD-10-CM

## 2015-05-18 DIAGNOSIS — N6320 Unspecified lump in the left breast, unspecified quadrant: Secondary | ICD-10-CM

## 2015-05-18 NOTE — Progress Notes (Addendum)
No complaints today.  Pap Smear:  Pap smear not completed today. Last Pap smear was 01/26/2014 at the [facility] and normal per patient. Per patient has no history of an abnormal Pap smear. No Pap smear results in EPIC.  Physical exam: Breasts Breasts symmetrical. No skin abnormalities bilateral breasts. No nipple retraction bilateral breasts. No nipple discharge bilateral breasts. No lymphadenopathy. Palpated a pea sized lump within the left breast at 12 o'clock 13 cm from the nipple. Palpated a lump within the right breast at 11 o'clock 2 cm from the nipple. Complaints of tenderness when palpated lump within the left breast. Referred patient to the [facility] for bilateral diagnostic mammogram per recommendation. Appointment scheduled for Friday, May 26, 2015 at 0830.        Pelvic/Bimanual No Pap smear completed today since last Pap smear was 01/26/2014. Pap smear not indicated per BCCCP guidelines.   Used interpreter Nira Conn.

## 2015-05-18 NOTE — Patient Instructions (Signed)
Educational materials on self breast awareness given. Explained to Jasmine Harvey that she did not need a Pap smear today due to last Pap smear was 01/26/2014 per patient. Let her know BCCCP will cover Pap smears every 5 years unless has a history of abnormal Pap smears. Referred patient to the [facility] of [facility] for bilateral diagnostic mammogram per recommendation. Appointment scheduled for Friday, May 26, 2015 at 0830. Jasmine Harvey verbalized understanding.  Jeyla Bulger, Kathaleen Maser, RN 2:15 PM

## 2015-05-25 NOTE — Addendum Note (Signed)
Encounter addended by: Priscille Heidelberghristine P Addisynn Vassell, RN on: 05/25/2015 10:16 AM<BR>     Documentation filed: Notes Section

## 2015-05-26 ENCOUNTER — Other Ambulatory Visit (HOSPITAL_COMMUNITY): Payer: Self-pay | Admitting: Obstetrics and Gynecology

## 2015-05-26 ENCOUNTER — Ambulatory Visit
Admission: RE | Admit: 2015-05-26 | Discharge: 2015-05-26 | Disposition: A | Discharge status: Home / Self Care | Payer: No Typology Code available for payment source | Source: Ambulatory Visit | Attending: Obstetrics and Gynecology | Admitting: Obstetrics and Gynecology

## 2015-05-26 DIAGNOSIS — N6021 Fibroadenosis of right breast: Secondary | ICD-10-CM

## 2015-05-26 DIAGNOSIS — R921 Mammographic calcification found on diagnostic imaging of breast: Secondary | ICD-10-CM

## 2015-05-26 DIAGNOSIS — N6310 Unspecified lump in the right breast, unspecified quadrant: Secondary | ICD-10-CM

## 2015-05-26 DIAGNOSIS — N6022 Fibroadenosis of left breast: Secondary | ICD-10-CM

## 2015-06-21 ENCOUNTER — Ambulatory Visit
Admission: RE | Admit: 2015-06-21 | Discharge: 2015-06-21 | Disposition: A | Discharge status: Home / Self Care | Payer: No Typology Code available for payment source | Source: Ambulatory Visit | Attending: Obstetrics and Gynecology | Admitting: Obstetrics and Gynecology

## 2015-06-21 DIAGNOSIS — N6310 Unspecified lump in the right breast, unspecified quadrant: Secondary | ICD-10-CM

## 2015-06-21 DIAGNOSIS — R921 Mammographic calcification found on diagnostic imaging of breast: Secondary | ICD-10-CM

## 2016-01-18 ENCOUNTER — Other Ambulatory Visit: Payer: Self-pay

## 2016-01-18 ENCOUNTER — Ambulatory Visit: Payer: Self-pay

## 2016-01-18 VITALS — BP 140/92 | HR 62 | Temp 98.0°F | Resp 14 | Ht 62.0 in | Wt 164.0 lb

## 2016-01-18 DIAGNOSIS — Z0000 Encounter for general adult medical examination without abnormal findings: Secondary | ICD-10-CM

## 2016-01-18 LAB — LIPID PANEL
CHOL/HDL RATIO: 3.5 ratio (ref 0.0–4.4)
Cholesterol, Total: 184 mg/dL (ref 100–199)
HDL: 52 mg/dL (ref >39)
LDL Calculated: 95 mg/dL (ref 0–99)
Triglycerides: 185 mg/dL — ABNORMAL HIGH (ref 0–149)
VLDL CHOLESTEROL CAL: 37 mg/dL (ref 5–40)

## 2016-01-18 LAB — HEMOGLOBIN A1C
ESTIMATED AVERAGE GLUCOSE: 108 mg/dL
HEMOGLOBIN A1C: 5.4 % (ref 4.8–5.6)

## 2016-01-18 NOTE — Progress Notes (Signed)
Patient [facility] and is currently a B[facility]CP patient effective 05/18/2015.   Clinical Measurements: Patient is 5 ft. 2 inches, weight 164 lbs, and BMI 30.1.   Medical History: Patient has no history of high cholesterol. Patient does not have a history of hypertension or diabetes. Per patient no diagnosed history of coronary heart disease, heart attack, heart failure, stroke/TIA, vascular disease or congenital heart defects.  Blood Pressure, Self-measurement: Patient states has no reason to check Blood pressure.  Nutrition Assessment: Patient stated that eats 2 to 3 fruits every day. Patient states she eats 1 to 2 servings of vegetables a week. Per patient does eat 3 or more ounces of whole grains daily. Patient doesn't eat two or more servings of fish weekly. Patient states that eats fish once a week.Patient states she does not drink more than 36 ounces or 450 calories of beverages with added sugars weekly. Patient states she drinks a lot of water. Patient stated she does  watch her salt intake. Patient received health coaching on nutrition and Activity.  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  Smoking Status: Patient stated that has never smoked and is not exposed to smoke.  Quality of Life Assessment: In assessing patient's physical quality of life she stated that out of the past 30 days that she has felt her health was good all days. Patient also stated that in the past 30 days that her mental health was not good including stress, depression and problems with emotions for 2 to 3 days. Patient did state that out of the past 30 days she felt her physical or mental health had not kept her from doing her usual activities including self-care, work or recreation for all days.   Plan: Lab work will be done today including a lipid panel and Hgb A1C. Will call lab  results when they are finished. Patient will receive Health Coaching for risk reduction initially and then as patient wants.

## 2016-01-18 NOTE — Patient Instructions (Signed)
Patient will started to eat omega 3 fish twice a week. Will eat more vegetables.Discussed health assessment with patient.She will be called with results of lab work and we will then discussed any further follow up the patient needs. Patient verbalized understanding.

## 2016-01-19 ENCOUNTER — Telehealth: Payer: Self-pay

## 2016-01-19 ENCOUNTER — Ambulatory Visit: Payer: Self-pay

## 2016-01-19 NOTE — Progress Notes (Signed)
APPOINTMENT: [facility] for an [facility] for patient due to above parameters [facility]. Patient has an appointment for Thursday, June 15 at 9 AM. Will tell patient about appointment when call lab results and ask patient to pick up Family Practice packet and orange card/[place] financial packet.  WISEWOMAN PATIENT NAVIGATION: [facility] concerning lab results of elevated  blood pressure.   NEEDS ASSESSMENT: Patient has barriers which are help understanding medical follow up needs from past, not being aware of access to services and the needed support.  PLAN of CARE; Patient now has access to services but will need to be followed up on follow through of doing Eligibility. Patient will be told to make eligibility appoint on first visit to doctor. Plus doctors office is aware of patients need. Will need to make sure what services are available to assist with doctor payment, medications and speciality care.   Patient will be approached for Health Coaching or other services to help with problem areas. Will discuss possible Heart Wise Program for blood pressure. Will explore possible other barriers patient may perceive.  Will need to see about follow equipment patient may need.  PLAN: Call patient for health coaching or other programs. Follow up per phone. Reassess as needed.

## 2016-01-19 NOTE — Progress Notes (Addendum)
This health coach session was done on 01/18/16. FIRST HEALTH COACH: During initial screening with Jasmine Harvey interpreter present, wellness health coaching was started.  Blood Pressure: Blood pressure readings were 140/92 and 140/90. Readings were in the hypertensive area and noticed that had been in that area last few visits to a clinic.Informed that would have to send to doctor and that some of elevation could be related to her pain. Discussed non changeable and changeable risk factors with patient. Discussed changeable risk factors specific to patient. These included overweight, pain and stress.   Nutrition: Discussed with patient that normally should eat 2 to 3 servings of fruits and vegetables a day. Informed patient that two servings of fish were recommended per week. Listed the fishes with omega three's for patient.Discussed that should have 5 to 7 ounces of fiber a day. Explained to patient that should eat fruits and vegetables high in fiber but 3 of her 5 to 7 grains of fiber should be whole grain. Examples of whole grains were given.   Informed about sugar and salt which patient is doing.   Physical Activity:  Patient was Informed that minimal moderate activity is 150 minutes a week or 90 minutes of vigorous. Patient is doing her minimal moderate exercise. Discussed adding in some flexeibily and balance exercises.  PLAN: Will set goal when call lab results and do second health coaching. Will think about what goals may want to set.

## 2016-01-22 ENCOUNTER — Telehealth: Payer: Self-pay

## 2016-01-22 NOTE — Progress Notes (Signed)
WISEWOMAN PATIENT NAVIGATION: [facility] elevated  blood pressure. Patient has appointment with doctor on 01/25/2016 concerning her blood pressure. Patient will pick up office packet and eligibility information before her doctor's appointment.  NEEDS ASSESSMENT: Patient does have barriers with help understanding medical follow up needs from past, not being aware of access to services, transportation and needed support.  PLAN of CARE; Patient  has had access to services but not followed up on follow through of doing Eligibility. Will look at possible transportation programs that are available. Will need to make sure what services are available to assist with doctor payment, medications and speciality care.  Patient will be approached for Health Coaching or other services to help with problem areas. Will discuss Heart Wise Program for blood pressure. Will explore possible other barriers patient may perceive.  PLAN: Call patient for health coaching or other programs. Follow up per phone.

## 2016-01-22 NOTE — Telephone Encounter (Signed)
LAB RESULTS  Called to inform about lab work from 01/18/16. Interpreter Delorise RoyalsJulie Sowell informed patient: BMI 31.1, cholesterol- 29.99 (overweight), HDL- 52, LDL- 95, triglycerides - 185, and HBG-A1C - 5.4.   SECOND HEALTH COACHING: Did risk reduction Health Coaching concerning BMI. Informed that normal BMI was 18 to 25 and patient is in overweight range. Discussed the need to loose weight, decrease sugar in diet and starches with examples given. Informed patient needs to stay away from drinks and food that have too much sugar. Discussed eating leaner cuts of meat and cut back on carbs.   DOCTOR'S APPOINTMENT: Informed the patient that [facility] Thursday, June 15 at 9 AM. Address given and informed patient that [facility].  PLAN: Will pick up packet and make eligibility appointment. Will decrease carbs, sugar and fats. Will go to doctors appointment.  TIME SPENT WITH PATIENT: 15 minutes..Marland Kitchen

## 2016-01-25 ENCOUNTER — Ambulatory Visit (INDEPENDENT_AMBULATORY_CARE_PROVIDER_SITE_OTHER): Payer: Self-pay | Admitting: Internal Medicine

## 2016-01-25 ENCOUNTER — Encounter: Payer: Self-pay | Admitting: Internal Medicine

## 2016-01-25 VITALS — BP 139/74 | HR 70 | Temp 98.3°F | Ht 62.0 in | Wt 164.0 lb

## 2016-01-25 DIAGNOSIS — K8020 Calculus of gallbladder without cholecystitis without obstruction: Secondary | ICD-10-CM

## 2016-01-25 DIAGNOSIS — K8050 Calculus of bile duct without cholangitis or cholecystitis without obstruction: Secondary | ICD-10-CM

## 2016-01-25 DIAGNOSIS — K8010 Calculus of gallbladder with chronic cholecystitis without obstruction: Secondary | ICD-10-CM | POA: Insufficient documentation

## 2016-01-25 NOTE — Patient Instructions (Signed)
Cólico biliar °(Biliary Colic) °El cólico biliar es un dolor en la región superior del abdomen. El dolor: °· Generalmente, se siente en la región superior derecha del abdomen, pero también puede aparecer en la región central, justo debajo del esternón. °· Puede irradiarse a la espalda, hacia el omóplato derecho. °· Puede ser constante o discontinuo. °· Puede estar acompañado de náuseas y vómitos. °La mayor parte del tiempo, desaparece en el término de 1 a 5 horas. Una vez que el dolor intenso desaparece, puede haber dolor abdominal leve que se prolonga durante aproximadamente 24 horas. °La causa del cólico biliar es una obstrucción en las vías biliares. Las vías biliares son conductos que transportan bilis, un líquido que ayuda a digerir las grasas, desde la vesícula biliar hasta el intestino delgado. Generalmente, el cólico biliar ocurre después de comer, cuando el sistema digestivo necesita bilis. El dolor aparece cuando las células musculares se contraen bruscamente para intentar mover la obstrucción, para que la bilis pueda pasar. °INSTRUCCIONES PARA EL CUIDADO EN EL HOGAR °· Tome los medicamentos solamente como se lo haya indicado el médico. °· Beba suficiente líquido para mantener la orina clara o de color amarillo pálido. °· Evite los alimentos grasos y fritos. Este tipo de alimentos aumentan la demanda de bilis del organismo. °· Evite los alimentos que intensifican el dolor. °· No coma en exceso. °· No ingiera una comida abundante después de ayunar. °SOLICITE ATENCIÓN MÉDICA SI: °· Tiene fiebre. °· El dolor empeora. °· Vomita. °· Tiene náuseas que le impiden la ingesta de comidas y bebidas. °SOLICITE ATENCIÓN MÉDICA DE INMEDIATO SI: °· De manera repentina, tiene fiebre y escalofríos. °· Observa una coloración amarillenta (ictericia) de: °¨ La piel. °¨ Las partes blancas de los ojos. °¨ Las membranas mucosas. °· Siente dolor continuo o intenso que no se alivia con los medicamentos. °· Tiene náuseas y vómitos  que no se alivian con los medicamentos. °· Tiene mareos o se desmaya. °  °Esta información no tiene como fin reemplazar el consejo del médico. Asegúrese de hacerle al médico cualquier pregunta que tenga. °  °Document Released: 11/05/2007 Document Revised: 12/13/2014 °Elsevier Interactive Patient Education ©2016 Elsevier Inc. ° °

## 2016-01-25 NOTE — Progress Notes (Signed)
   [facility] [name], MD Phone: [phone]  Reason For Visit: [facility] Ref[facility]   # Blood pressure - not on any medications, blood pressure improved, no issues today, no dizziness or headaches.  Concerned about elevated cholesterol. Reassured patient all was normal   # Hx of Biliary Colic:  13 year hx of biliary colic, starting post-paratum. Patient has been seen in the ED twice once 11 years ago and once 3-4 years ago. She states she has had episodes of abdominal pain. It was initially associated with fried foods. Currently patient with pain every day she states that this pain is worse with food, has been ongoing for years. Over the past 2-3 months she states she is now having epigastric pain that radiates to her back. Denies any fever, chills, nausea, vomiting. Pain is worse with eating any food. Indicates having tan/light brown stools at times. Denies any blood in stools. Patient has no insurance, therefore concerns of whether she will be able to afford surgery.   2014 U/S consistent with gallbladder consistent with diffuse cholelithiasis.- no signs of cholecystitis  Past Medical History Reviewed problem list.  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 2014 U/S consistent with diffuse cholelithiasis. Over the past several years indicates intermittent dull pain, daily often associated with any type  of food. No insurance, therefore has not had gallbladder taken out. Patient with limited funds available. Would like a referral to surgery to address this issue. No findings of cholecystitis on exam, patient sitting in chair comfortable, denies fever or chills, nausea or vomiting  - Referral to surgery  - Will hold off on labs for now as patient with limited funds - Discussed patient obtaining the orange card. Plans to start this process today.

## 2016-01-25 NOTE — Assessment & Plan Note (Signed)
13 yr hx of biliary colic. 2014 U/S consistent with diffuse cholelithiasis. Over the past several years indicates intermittent dull pain, daily often associated with any type of food. No insurance, therefore has not had gallbladder taken out. Patient with limited funds available. Would like a referral to surgery to address this issue. No findings of cholecystitis on exam, patient sitting in chair comfortable, denies fever or chills, nausea or vomiting  - Referral to surgery  - Will hold off on labs for now as patient with limited funds - Discussed patient obtaining the orange card. Plans to start this process today.

## 2016-01-29 NOTE — Telephone Encounter (Signed)
LAB RESULTS  Called to inform about lab work from 01/18/16. Interpreter Delorise RoyalsJulie Sowell informed patient: BMI 30.1, cholesterol- 184, HDL- 52, LDL- 95, triglycerides - 185, and HBG-A1C - 5.4.   SECOND HEALTH COACHING: Did risk reduction Health Coaching concerning BMI. Informed that normal BMI was 18 to 25 and patient is in obese range. Discussed the need to loose weight, decrease fine sugar in diet and non fibrous starches with examples given. Informed patient that had brought A1C down to normal range but still needs to keep carbohydrates down. Patient asked about what did she needs to do concerning high triglycerides. Discussed that needed to avoid sugary and refined foods, limit cholesterol foods that eat, and exercise regularly  PLAN: Call in two or three weeks for third Health Coaching session.                                                                                                       TIME SPENT WITH PATIENT: 15 minutes..Marland Kitchen

## 2016-02-06 ENCOUNTER — Ambulatory Visit: Payer: Self-pay | Admitting: Surgery

## 2016-02-06 NOTE — H&P (Signed)
Jasmine Harvey 02/06/2016 8:36 AM Location: [facility] Patient #: [id number] DOB: [birth year] Single / Language: Spanish / Race: White Female  History of Present Illness Jasmine Harvey(Jasmine Harvey; 02/06/2016 12:01 PM) The patient is a 50 year old female who presents for evaluation of gall stones. Note for "Gall stones": Patient sent for surgical consultation by Jasmine CharsAsiyah Mikell, Harvey, for concern of sympathetic gallstones.  Patient comes today with her daughter. She speaks no [place]. We worked to set up an Equities traderinterpreter. Patient has had episodes of intermittent abdominal pain. Usually related to food. She's had attacks and she was pregnant over a decade ago. She is gone to the emergency room a few times. Workup showed gallstones. Rest of the etiology seems less suspicious.   Because of financial issues, she's tried to hold off on doing anything too aggressive. However she's having more frequent attacks. Worse with protein like chili or beef. Usually gets diffuse crampy abdominal pain. Seems to be worse in the right upper quadrant. We'll radiate to the back. She often will get full and bloated with this. Not particularly nauseated or vomiting bowel. The system with heartburn or reflux. She has some mild constipation which fruit juices and laxatives help control her. Usually moves her bowels at least every other day. She is moderately active. However she does have some ankle knee in elbow pains that limited her activity a little bit. She does not smoke. No history of coronary disease. She's never had abdominal surgery. No severe bouts of constipation or diarrhea. Not really tried much in the way medications to help deal with this. Seemed to start after her first pregnancy.  No personal nor family history of GI/colon cancer, inflammatory bowel disease, irritable bowel syndrome, allergy such as Celiac Sprue, dietary/dairy problems, colitis, ulcers nor gastritis. No recent  sick contacts/gastroenteritis. No travel outside the country. No changes in diet. No dysphagia to solids or liquids. No significant heartburn or reflux. No hematochezia, hematemesis, coffee ground emesis. No evidence of prior gastric/peptic ulceration.   Allergies (Jasmine Harvey, CMA; 02/06/2016 8:37 AM) No Known Drug Allergies 02/06/2016  Medication History (Jasmine Harvey, CMA; 02/06/2016 8:36 AM) No Current Medications Medications Reconciled    Vitals (Jasmine Harvey CMA; 02/06/2016 8:36 AM) 02/06/2016 8:36 AM Weight: 164 lb Height: 62in Body Surface Area: 1.76 m Body Mass Index: 30 kg/m  Temp.: 95F(Temporal)  Pulse: 75 (Regular)  BP: 130/70 (Sitting, Left Arm, Standard)      Physical Exam Jasmine Harvey(Jasmine Harvey; 02/06/2016 9:27 AM)  General Mental Status-Alert. General Appearance-Not in acute distress, Not Sickly. Orientation-Oriented X3. Hydration-Well hydrated. Voice-Normal.  Integumentary Global Assessment Upon inspection and palpation of skin surfaces of the - Axillae: non-tender, no inflammation or ulceration, no drainage. and Distribution of scalp and body hair is normal. General Characteristics Temperature - normal warmth is noted.  Head and Neck Head-normocephalic, atraumatic with no lesions or palpable masses. Face Global Assessment - atraumatic, no absence of expression. Neck Global Assessment - no abnormal movements, no bruit auscultated on the right, no bruit auscultated on the left, no decreased range of motion, non-tender. Trachea-midline. Thyroid Gland Characteristics - non-tender.  Eye Eyeball - Left-Extraocular movements intact, No Nystagmus. Eyeball - Right-Extraocular movements intact, No Nystagmus. Cornea - Left-No Hazy. Cornea - Right-No Hazy. Sclera/Conjunctiva - Left-No scleral icterus, No Discharge. Sclera/Conjunctiva - Right-No scleral icterus, No Discharge. Pupil - Left-Direct reaction to light  normal. Pupil - Right-Direct reaction to light normal.  ENMT Ears Pinna - Left - no drainage observed,  no generalized tenderness observed. Right - no drainage observed, no generalized tenderness observed. Nose and Sinuses External Inspection of the Nose - no destructive lesion observed. Inspection of the nares - Left - quiet respiration. Right - quiet respiration. Mouth and Throat Lips - Upper Lip - no fissures observed, no pallor noted. Lower Lip - no fissures observed, no pallor noted. Nasopharynx - no discharge present. Oral Cavity/Oropharynx - Tongue - no dryness observed. Oral Mucosa - no cyanosis observed. Hypopharynx - no evidence of airway distress observed.  Chest and Lung Exam Inspection Movements - Normal and Symmetrical. Accessory muscles - No use of accessory muscles in breathing. Palpation Palpation of the chest reveals - Non-tender. Auscultation Breath sounds - Normal and Clear.  Cardiovascular Auscultation Rhythm - Regular. Murmurs & Other Heart Sounds - Auscultation of the heart reveals - No Murmurs and No Systolic Clicks.  Abdomen Inspection Inspection of the abdomen reveals - No Visible peristalsis and No Abnormal pulsations. Umbilicus - No Bleeding, No Urine drainage. Palpation/Percussion Palpation and Percussion of the abdomen reveal - Soft, Non Tender, No Rebound tenderness, No Rigidity (guarding) and No Cutaneous hyperesthesia. Note: Mild diffuse abdominal discomfort. Most sensitive in right upper quadrant. No true Murphy sign. No diastases recti. No umbilical hernia  Female Genitourinary Sexual Maturity Tanner 5 - Adult hair pattern. Note: Nontender. No inguinal hernias. No major vaginal bleeding nor foul discharge  Peripheral Vascular Upper Extremity Inspection - Left - No Cyanotic nailbeds, Not Ischemic. Right - No Cyanotic nailbeds, Not Ischemic.  Neurologic Neurologic evaluation reveals -normal attention span and ability to concentrate, able  to name objects and repeat phrases. Appropriate fund of knowledge , normal sensation and normal coordination. Mental Status Affect - not angry, not paranoid. Cranial Nerves-Normal Bilaterally. Gait-Normal.  Neuropsychiatric Mental status exam performed with findings of-able to articulate well with normal speech/language, rate, volume and coherence, thought content normal with ability to perform basic computations and apply abstract reasoning and no evidence of hallucinations, delusions, obsessions or homicidal/suicidal ideation.  Musculoskeletal Global Assessment Spine, Ribs and Pelvis - no instability, subluxation or laxity. Right Upper Extremity - no instability, subluxation or laxity.  Lymphatic Head & Neck  General Head & Neck Lymphatics: Bilateral - Description - No Localized lymphadenopathy. Axillary  General Axillary Region: Bilateral - Description - No Localized lymphadenopathy. Femoral & Inguinal  Generalized Femoral & Inguinal Lymphatics: Left - Description - No Localized lymphadenopathy. Right - Description - No Localized lymphadenopathy.    Assessment & Plan Jasmine Sportsman Harvey; 02/06/2016 12:04 PM)  CHRONIC CHOLECYSTITIS WITH CALCULUS (K80.10) Impression: Rather classic story biliary colic. Also some component of constipation as well.  I think she would benefit from cholecystectomy. The rest of the differential diagnosis seems unlikely. I discussed at length with the patient & her daughter with the help of a Spanish interpreter. Techniques risk benefits alternatives discussed. Questions answered. They wish to proceed. We'll work to coordinate a convenient time.  A major concern for her is the financials. I think she is warned Flossie Buffy in [place] some financial help. I think that's big reason she's delayed surgery for the past decade. Hopefully we can find a way to do this for her  Current Plans You are being scheduled for surgery - Our schedulers will call  you.  You should hear from our office's scheduling department within 5 working days about the location, date, and time of surgery. We try to make accommodations for patient's preferences in scheduling surgery, but sometimes the OR schedule or  the surgeon's schedule prevents us from making those accommodations.  If you have not heard from our office [phone]) in 5 working days, call the office and ask for your surgeon's nurse.  If you have other questions about your diagnosis, plan, or surgery, call the office and ask for your surgeon's nurse.  The anatomy & physiology of hepatobiliary & pancreatic function was discussed. The pathophysiology of gallbladder dysfunction was discussed. Natural history risks without surgery was discussed. I feel the risks of no intervention will lead to serious problems that outweigh the operative risks; therefore, I recommended cholecystectomy to remove the pathology. I explained laparoscopic techniques with possible need for an open approach. Probable cholangiogram to evaluate the bilary tract was explained as well.  Risks such as bleeding, infection, abscess, leak, injury to other organs, need for further treatment, heart attack, death, and other risks were discussed. I noted a good likelihood this will help address the problem. Possibility that this will not correct all abdominal symptoms was explained. Goals of post-operative recovery were discussed as well. We will work to minimize complications. An educational handout further explaining the pathology and treatment options was given as well. Questions were answered. The patient expresses understanding & wishes to proceed with surgery.  Pt Education - Pamphlet Given - Laparoscopic Gallbladder Surgery: discussed with patient and provided information. Written instructions provided Pt Education - [facility] Laparosopic Post Op HCI (Malcomb Gangemi) Pt Education - [facility] Good Bowel Health (Saraann Enneking) Pt Education - Laparoscopic  Cholecystectomy: gallbladder  Jasmine SportsmanSteven C. Lundon Rosier, M.D., F.A.C.S. Gastrointestinal and Minimally Invasive Surgery [facility] [address] [place], [place] [id number] [phone] Main / Paging

## 2016-02-09 ENCOUNTER — Telehealth: Payer: Self-pay

## 2016-02-09 NOTE — Telephone Encounter (Signed)
[facility] Patient called per phone today for Health Coaching with interpreter Jasmine SenegalMarly Harvey present. Patient's areas of concern are triglycerides and BP. Patient stated BP all right at doctor's office Explained that her blood pressure runs borderline and offered HEART WISE Program. Patient is not interested at this time. Stated that doctor was referring her to get her gallbladder surgery.  HEALTH COACHING: Patient stated that had decreased the amount of bread, rice, and grease she is eating. Per patient has increased the amount of vegetables that she is eating. When asked about refine sugar patient stated that was not eating white bread or sugar. Reminded patient about too much white rice. Informed that was better to eat brown rice or other high fiber rice. Informed patient that she could call if becomes interested in monitoring BP. Tried to stress the importance of monitoring with her history.   PLAN: Will Call in 3 to 4 weeks. Will continue with increasing exercise. Will continue with the good meal and nutrition changes. Will be doing follow up assessment when call next.

## 2016-02-16 ENCOUNTER — Ambulatory Visit: Payer: No Typology Code available for payment source | Attending: Internal Medicine

## 2016-02-20 ENCOUNTER — Encounter: Payer: Self-pay | Admitting: Internal Medicine

## 2016-02-20 ENCOUNTER — Ambulatory Visit (INDEPENDENT_AMBULATORY_CARE_PROVIDER_SITE_OTHER): Payer: No Typology Code available for payment source | Admitting: Internal Medicine

## 2016-02-20 VITALS — BP 120/77 | HR 67 | Temp 98.0°F | Ht 62.0 in | Wt 164.0 lb

## 2016-02-20 DIAGNOSIS — K8050 Calculus of bile duct without cholangitis or cholecystitis without obstruction: Secondary | ICD-10-CM

## 2016-02-20 DIAGNOSIS — R309 Painful micturition, unspecified: Secondary | ICD-10-CM

## 2016-02-20 DIAGNOSIS — M130 Polyarthritis, unspecified: Secondary | ICD-10-CM

## 2016-02-20 DIAGNOSIS — K8020 Calculus of gallbladder without cholecystitis without obstruction: Secondary | ICD-10-CM

## 2016-02-20 LAB — POCT URINALYSIS DIPSTICK
BILIRUBIN UA: NEGATIVE
Glucose, UA: NEGATIVE
KETONES UA: NEGATIVE
Nitrite, UA: NEGATIVE
PROTEIN UA: NEGATIVE
SPEC GRAV UA: 1.02
Urobilinogen, UA: 1
pH, UA: 7.5

## 2016-02-20 LAB — POCT UA - MICROSCOPIC ONLY

## 2016-02-20 NOTE — Progress Notes (Signed)
[facility] Noralee CharsAsiyah Amando Ishikawa, MD Phone: [phone]  Reason For Visit:  New Patient, [facility] and other issues   Biliary Colic, F/U  - Patient was seen biliary colic in the past. States she has RUQ pain most days. This chronic in nature. Bilary colic causes pain which is worse after eating at night. Patient was referred by to surgery at last visit. Patient saw surgeon whom will schedule patient for surgery per patient.   Swelling/Pain in right fingers/Right Knee, and right Ankle  - Pain and swelling in the right distal metacarpal joint and lateral ankle. Pain in right knee.  - Patient has been having pain in joints for about 1 month. Pain will keep patient up a night. Pain exacerbated by movement.  - no erthyema associated with this  - No hx of trauma  - No hx of pain in joints in the past  - No numbness or tingling in joints - No fever or chills  - No recent hx of illness - More sensitive to the cold, no issue with weight gain.  - Has not tried any medications anything  - No rashes or changes in skin  Pain with Urination/Bladder Pain  - Pain with urination x 1 day  - Slight suprapubic pain x 1 night.  - No fevers or chills   Past Medical History Reviewed problem list.  Medications- reviewed and updated No additions to family history Social history- patient is a non smoker  Objective: BP 120/77 mmHg  Pulse 67  Temp(Src) 98 F (36.7 C) (Oral)  Ht 5\' 2"  (1.575 m)  Wt 164 lb (74.39 kg)  BMI 29.99 kg/m2 Ge n: NAD, alert, cooperative with exam HEENT: no cervical lymphadenopathy, patient with possible goiter on exam  Cardiac: RRR, no murmurs, rubs or gallops  Lungs: CTAB, no wheezing  MUS:  Hands: Right hand with swelling 3rd, 4th, 5th distal metacarpals, no pain on palpation in metacarpals or phalanges joints. Normal ROM, 5/5 strength, 2+ radial pulses  Left hand: no visual deformities, no pain on palpation,  normal ROM,5/5 strength  Foot : Right ankle swelling along the lateral malleolus, no pain on palpation of lateral malleolus, 5th metatarsal, medical malleolus, or navicular, talus. Normal ROM, 5/5 strength. DP pulses 2+   Left foot/ankle: no visual deformities, no pain on palpation, normal ROM,5/5 strength  Right Knee exam: no visual deformities, no pain on palpation, normal ROM,5/5 strength. Negative anterior draw sign.   Abdominal Exam: RUQ with pain to palpation (chronic in nature), slight suprapubic pain (acute) on palpation. Negative for RLQ pain, positive LUQ pain (chronic (years) per patient)   Assessment/Plan: See problem based a/p   Polyarthritis 1 month of polyarthitis with swelling in 3/4/5 th metacarpal digits and right lateral ankle. No fevers/chill, erythema, significant pain on palpation, weakness, radiculopathy, weight loss.  Differential diagnosis: Rheumatoid arthritis vs SLE vs reactive arthritis (patient complaining of dysuria, though onset recent making E.coli induce reactive arthritis less likely given timeline. Patient denies a previous illness in the last month). Therefore would consider more like RA vs SLE. Per review should rule out hypothyroidism with polyarticular joint pain.  - Will get CBC, ANA, CRP, and RA  - Will follow up with patient in 2 weeks  - Consider imaging, however likely to be low yield given patient's recent onset of symptoms, normal ROM, and no pain to palpation on exam  - Will consider referral rheumatology at next visit,  depending on results and patient's status   Pain with urination Dysuria at night and suprapubic pain of 1 days duration. Patient sexually active, has not used birthcontrol in over 1 year, states she has gone through menopause. No vaginal discharge.  - Will get a UA and urine culture.  - Follow up in 2 weeks - Precautions provided  Recurrent biliary colic Chronic, stable  - seen by general surgery follow referral  - plan to  perform cholecystectomy - precautions provided   Discussed with Dr. Lum Babe

## 2016-02-20 NOTE — Patient Instructions (Addendum)
Follow up with surgery for gallbladder. If you have worsening abdominal pain, fever or chills go to emergency room to be evaluated Ibuprofen for pain at night for joint pain. I want to see you back in two weeks to follow up

## 2016-02-21 DIAGNOSIS — R300 Dysuria: Secondary | ICD-10-CM | POA: Insufficient documentation

## 2016-02-21 DIAGNOSIS — R309 Painful micturition, unspecified: Secondary | ICD-10-CM | POA: Insufficient documentation

## 2016-02-21 NOTE — Assessment & Plan Note (Addendum)
1 month of polyarthitis with swelling in 3/4/5 th metacarpal digits and right lateral ankle. No fevers/chill, erythema, significant pain on palpation, weakness, radiculopathy, weight loss.  Differential diagnosis: Rheumatoid arthritis vs SLE vs reactive arthritis (patient complaining of dysuria, though onset recent making E.coli induce reactive arthritis less likely given timeline. Patient denies a previous illness in the last month). Therefore would consider more like RA vs SLE. Per review should rule out hypothyroidism with polyarticular joint pain.  - Will get CBC, ANA, CRP, and RA  - Will follow up with patient in 2 weeks  - Consider imaging, however likely to be low yield given patient's recent onset of symptoms, normal ROM, and no pain to palpation on exam  - Will consider referral rheumatology at next visit, depending on results and patient's status

## 2016-02-21 NOTE — Assessment & Plan Note (Addendum)
Chronic, stable  - seen by general surgery follow referral  - plan to perform cholecystectomy - precautions provided

## 2016-02-21 NOTE — Assessment & Plan Note (Addendum)
Dysuria at night and suprapubic pain of 1 days duration. Patient sexually active, has not used birthcontrol in over 1 year, states she has gone through menopause. No vaginal discharge.  - Will get a UA and urine culture.  - Follow up in 2 weeks - Precautions provided

## 2016-02-22 LAB — URINE CULTURE
COLONY COUNT: NO GROWTH
Organism ID, Bacteria: NO GROWTH

## 2016-03-04 NOTE — Progress Notes (Signed)
Cancelling order as lab not collected, no need to re-order 

## 2016-03-05 ENCOUNTER — Ambulatory Visit: Payer: Self-pay | Admitting: Internal Medicine

## 2016-03-07 ENCOUNTER — Telehealth: Payer: Self-pay | Admitting: Internal Medicine

## 2016-03-07 NOTE — Telephone Encounter (Signed)
Called patient to notify her of results of her UA. No concern for infection. I also wanted to follow up on her lab work. We were not able to draw labs that afternoon for patient, and therefore she was instructed to come back. However, patient did not return. Per patient, she is busy arranging to gallbladder removal and does not have time to return for labs. Per her, joint pain and swelling has improved, and she in no longer having any suprapubic pain. Explained to patient that if joint pain or swelling returns I would like her follow up with me, and get the blood work that I previously ordered. She voiced understanding, and had no other questions.

## 2016-03-15 ENCOUNTER — Encounter (HOSPITAL_COMMUNITY): Payer: Self-pay

## 2016-03-15 ENCOUNTER — Encounter (HOSPITAL_COMMUNITY)
Admission: RE | Admit: 2016-03-15 | Discharge: 2016-03-15 | Disposition: A | Discharge status: Home / Self Care | Payer: Self-pay | Source: Ambulatory Visit | Attending: Surgery | Admitting: Surgery

## 2016-03-15 DIAGNOSIS — K811 Chronic cholecystitis: Secondary | ICD-10-CM | POA: Insufficient documentation

## 2016-03-15 DIAGNOSIS — Z01812 Encounter for preprocedural laboratory examination: Secondary | ICD-10-CM | POA: Insufficient documentation

## 2016-03-15 HISTORY — DX: Personal history of other diseases of the digestive system: Z87.19

## 2016-03-15 HISTORY — DX: Localized edema: R60.0

## 2016-03-15 LAB — CBC
HCT: 43.4 % (ref 36.0–46.0)
HEMOGLOBIN: 14.7 g/dL (ref 12.0–15.0)
MCH: 30.9 pg (ref 26.0–34.0)
MCHC: 33.9 g/dL (ref 30.0–36.0)
MCV: 91.4 fL (ref 78.0–100.0)
PLATELETS: 258 10*3/uL (ref 150–400)
RBC: 4.75 MIL/uL (ref 3.87–5.11)
RDW: 12.9 % (ref 11.5–15.5)
WBC: 6.7 10*3/uL (ref 4.0–10.5)

## 2016-03-15 LAB — HCG, SERUM, QUALITATIVE: PREG SERUM: NEGATIVE

## 2016-03-15 NOTE — Patient Instructions (Signed)
Lelah Volkers  03/15/2016   Your procedure is scheduled on: 03-22-16  Report to [facility] Main  Entrance take [facility]  elevators to 3rd floor to  Short Stay Center at 1030 AM.  Call this number if you have problems the morning of surgery [phone]   Remember: ONLY 1 PERSON MAY GO WITH YOU TO SHORT STAY TO GET  READY MORNING OF YOUR SURGERY.  Do not eat food or drink liquids :After Midnight.     Take these medicines the morning of surgery with A SIP OF WATER: NONE DO NOT TAKE ANY DIABETIC MEDICATIONS DAY OF YOUR SURGERY                               You may not have any metal on your body including hair pins and              piercings  Do not wear jewelry, make-up, lotions, powders or perfumes, deodorant             Do not wear nail polish.  Do not shave  48 hours prior to surgery.              Men may shave face and neck.   Do not bring valuables to the hospital. [facility] IS NOT             RESPONSIBLE   FOR VALUABLES.  Contacts, dentures or bridgework may not be worn into surgery.  Leave suitcase in the car. After surgery it may be brought to your room.     Patients discharged the day of surgery will not be allowed to drive home.  Name and phone number of your driver:Octavio Cortez-significant other [phone] cell  Special Instructions: N/A              Please read over the following fact sheets you were given: _____________________________________________________________________             [facility] - Preparing for Surgery Before surgery, you can play an important role.  Because skin is not sterile, your skin needs to be as free of germs as possible.  You can reduce the number of germs on your skin by washing with CHG (chlorahexidine gluconate) soap before surgery.  CHG is an antiseptic cleaner which kills germs and bonds with the skin to continue killing germs even after washing. Please DO NOT use if you have an allergy to CHG or  antibacterial soaps.  If your skin becomes reddened/irritated stop using the CHG and inform your nurse when you arrive at Short Stay. Do not shave (including legs and underarms) for at least 48 hours prior to the first CHG shower.  You may shave your face/neck. Please follow these instructions carefully:  1.  Shower with CHG Soap the night before surgery and the  morning of Surgery.  2.  If you choose to wash your hair, wash your hair first as usual with your  normal  shampoo.  3.  After you shampoo, rinse your hair and body thoroughly to remove the  shampoo.                           4.  Use CHG as you would any other liquid soap.  You can apply chg directly  to the skin and  wash                       Gently with a scrungie or clean washcloth.  5.  Apply the CHG Soap to your body ONLY FROM THE NECK DOWN.   Do not use on face/ open                           Wound or open sores. Avoid contact with eyes, ears mouth and genitals (private parts).                       Wash face,  Genitals (private parts) with your normal soap.             6.  Wash thoroughly, paying special attention to the area where your surgery  will be performed.  7.  Thoroughly rinse your body with warm water from the neck down.  8.  DO NOT shower/wash with your normal soap after using and rinsing off  the CHG Soap.                9.  Pat yourself dry with a clean towel.            10.  Wear clean pajamas.            11.  Place clean sheets on your bed the night of your first shower and do not  sleep with pets. Day of Surgery : Do not apply any lotions/deodorants the morning of surgery.  Please wear clean clothes to the hospital/surgery center.  FAILURE TO FOLLOW THESE INSTRUCTIONS MAY RESULT IN THE CANCELLATION OF YOUR SURGERY PATIENT SIGNATURE_________________________________  NURSE SIGNATURE__________________________________  ________________________________________________________________________

## 2016-03-18 ENCOUNTER — Ambulatory Visit: Payer: Self-pay | Admitting: Internal Medicine

## 2016-03-20 ENCOUNTER — Telehealth: Payer: Self-pay

## 2016-03-20 NOTE — Telephone Encounter (Signed)
Patient called per Jasmine SenegalMarly Harvey interpreter for final assessment.  ASSESSMENT :This is a follow up Assessment following [facility] . Marland Kitchen.  Medication Status : Patient states is not taking medication for high cholesterol, hypertension, or diabetes.   Blood Pressure, Self-measurement: Patient states has no reason to check Blood pressure.  Nutrition Assessment: Patient stated that eats 3 to 4 fruits every day. Patient states she eats 2 to 3 servings of vegetables a day. Per Patient eats 3 or more ounces of whole grains daily. Patient stated doesn't eat two or more servings of fish weekly. States that eat fish once a week. Patient states she does not drink more than 36 ounces or 450 calories of beverages with added sugars weekly. Patient stated she does watch her salt intake.   Physical Activity Assessment: Patient stated she does around 180 minutes of moderate and 180 minutes of vigorous activity per week.  Smoking Status: Patient stated has never smoked and is not exposed to smoke.  Quality of Life Assessment: In assessing patient's Physical quality of life she stated that out of the past 30 days that she has felt her health is good all of them. Patient also stated that in the past 30 days that her mental health was good including stress, depression and problems with emotions for all days. Patient did state that out of the past 30 days she felt her physical or mental health had not kept her from doing her usual activities including self-care, work or recreation.   PLAN: Informed patient that would re screen if does not have insurance when and [facility]  TIME SPENT with PATIENT: 15 minutes

## 2016-03-22 ENCOUNTER — Ambulatory Visit (HOSPITAL_COMMUNITY): Payer: Self-pay

## 2016-03-22 ENCOUNTER — Ambulatory Visit (HOSPITAL_COMMUNITY): Payer: Self-pay | Admitting: Anesthesiology

## 2016-03-22 ENCOUNTER — Encounter (HOSPITAL_COMMUNITY)
Admission: RE | Disposition: A | Discharge status: Home / Self Care | Payer: Self-pay | Source: Ambulatory Visit | Attending: Surgery

## 2016-03-22 ENCOUNTER — Encounter (HOSPITAL_COMMUNITY): Payer: Self-pay | Admitting: *Deleted

## 2016-03-22 ENCOUNTER — Ambulatory Visit (HOSPITAL_COMMUNITY)
Admission: RE | Admit: 2016-03-22 | Discharge: 2016-03-22 | Disposition: A | Discharge status: Home / Self Care | Payer: Self-pay | Source: Ambulatory Visit | Attending: Surgery | Admitting: Surgery

## 2016-03-22 DIAGNOSIS — Z419 Encounter for procedure for purposes other than remedying health state, unspecified: Secondary | ICD-10-CM

## 2016-03-22 DIAGNOSIS — K5900 Constipation, unspecified: Secondary | ICD-10-CM | POA: Insufficient documentation

## 2016-03-22 DIAGNOSIS — K8010 Calculus of gallbladder with chronic cholecystitis without obstruction: Secondary | ICD-10-CM | POA: Diagnosis present

## 2016-03-22 HISTORY — PX: LAPAROSCOPIC CHOLECYSTECTOMY SINGLE SITE WITH INTRAOPERATIVE CHOLANGIOGRAM: SHX6538

## 2016-03-22 SURGERY — LAPAROSCOPIC CHOLECYSTECTOMY SINGLE SITE WITH INTRAOPERATIVE CHOLANGIOGRAM
Anesthesia: General | Site: Abdomen

## 2016-03-22 MED ORDER — PROPOFOL 10 MG/ML IV BOLUS
INTRAVENOUS | Status: DC | PRN
  Administered 2016-03-22: 160 mg via INTRAVENOUS

## 2016-03-22 MED ORDER — DEXAMETHASONE SODIUM PHOSPHATE 10 MG/ML IJ SOLN
INTRAMUSCULAR | Status: AC
  Filled 2016-03-22: qty 1

## 2016-03-22 MED ORDER — OXYCODONE HCL 5 MG PO TABS: 5.0000 mg | ORAL_TABLET | Freq: Four times a day (QID) | ORAL | 0 refills | Status: DC | PRN

## 2016-03-22 MED ORDER — NAPROXEN 500 MG PO TABS: 500.0000 mg | ORAL_TABLET | Freq: Two times a day (BID) | ORAL | 1 refills | Status: DC

## 2016-03-22 MED ORDER — ROCURONIUM BROMIDE 100 MG/10ML IV SOLN
INTRAVENOUS | Status: DC | PRN
  Administered 2016-03-22: 30 mg via INTRAVENOUS

## 2016-03-22 MED ORDER — DEXAMETHASONE SODIUM PHOSPHATE 10 MG/ML IJ SOLN
INTRAMUSCULAR | Status: DC | PRN
  Administered 2016-03-22: 10 mg via INTRAVENOUS

## 2016-03-22 MED ORDER — FENTANYL CITRATE (PF) 100 MCG/2ML IJ SOLN
INTRAMUSCULAR | Status: DC | PRN
Start: 2016-03-22 — End: 2016-03-22
  Administered 2016-03-22 (×2): 50 ug via INTRAVENOUS
  Administered 2016-03-22 (×2): 25 ug via INTRAVENOUS
  Administered 2016-03-22 (×2): 50 ug via INTRAVENOUS

## 2016-03-22 MED ORDER — GABAPENTIN 300 MG PO CAPS
300.0000 mg | ORAL_CAPSULE | ORAL | Status: AC
  Administered 2016-03-22: 300 mg via ORAL
  Filled 2016-03-22: qty 1

## 2016-03-22 MED ORDER — CHLORHEXIDINE GLUCONATE CLOTH 2 % EX PADS: 6.0000 | MEDICATED_PAD | Freq: Once | CUTANEOUS | Status: DC

## 2016-03-22 MED ORDER — ONDANSETRON HCL 4 MG/2ML IJ SOLN
INTRAMUSCULAR | Status: DC | PRN
  Administered 2016-03-22: 4 mg via INTRAVENOUS

## 2016-03-22 MED ORDER — SUCCINYLCHOLINE CHLORIDE 20 MG/ML IJ SOLN
INTRAMUSCULAR | Status: DC | PRN
  Administered 2016-03-22: 100 mg via INTRAVENOUS

## 2016-03-22 MED ORDER — LIDOCAINE HCL (CARDIAC) 20 MG/ML IV SOLN
INTRAVENOUS | Status: DC | PRN
  Administered 2016-03-22: 100 mg via INTRAVENOUS

## 2016-03-22 MED ORDER — LACTATED RINGERS IR SOLN
Status: DC | PRN
  Administered 2016-03-22: 1000 mL

## 2016-03-22 MED ORDER — BUPIVACAINE-EPINEPHRINE 0.25% -1:200000 IJ SOLN
INTRAMUSCULAR | Status: AC
  Filled 2016-03-22: qty 1

## 2016-03-22 MED ORDER — ONDANSETRON HCL 4 MG/2ML IJ SOLN
INTRAMUSCULAR | Status: AC
Start: 2016-03-22 — End: 2016-03-22
  Filled 2016-03-22: qty 2

## 2016-03-22 MED ORDER — EPHEDRINE SULFATE 50 MG/ML IJ SOLN
INTRAMUSCULAR | Status: DC | PRN
  Administered 2016-03-22: 10 mg via INTRAVENOUS
  Administered 2016-03-22: 5 mg via INTRAVENOUS

## 2016-03-22 MED ORDER — SUGAMMADEX SODIUM 200 MG/2ML IV SOLN
INTRAVENOUS | Status: DC | PRN
  Administered 2016-03-22: 150 mg via INTRAVENOUS

## 2016-03-22 MED ORDER — ACETAMINOPHEN 500 MG PO TABS
1000.0000 mg | ORAL_TABLET | ORAL | Status: AC
  Administered 2016-03-22: 1000 mg via ORAL
  Filled 2016-03-22: qty 2

## 2016-03-22 MED ORDER — FENTANYL CITRATE (PF) 250 MCG/5ML IJ SOLN
INTRAMUSCULAR | Status: AC
  Filled 2016-03-22: qty 5

## 2016-03-22 MED ORDER — IOPAMIDOL (ISOVUE-300) INJECTION 61%
INTRAVENOUS | Status: AC
  Filled 2016-03-22: qty 50

## 2016-03-22 MED ORDER — LACTATED RINGERS IV SOLN
INTRAVENOUS | Status: DC
  Administered 2016-03-22: 1000 mL via INTRAVENOUS

## 2016-03-22 MED ORDER — OXYCODONE HCL 5 MG PO TABS: 10.0000 mg | ORAL_TABLET | Freq: Four times a day (QID) | ORAL | Status: DC | PRN

## 2016-03-22 MED ORDER — HYDROMORPHONE HCL 1 MG/ML IJ SOLN: 0.2500 mg | INTRAMUSCULAR | Status: DC | PRN

## 2016-03-22 MED ORDER — MIDAZOLAM HCL 2 MG/2ML IJ SOLN
INTRAMUSCULAR | Status: AC
  Filled 2016-03-22: qty 2

## 2016-03-22 MED ORDER — SUGAMMADEX SODIUM 200 MG/2ML IV SOLN
INTRAVENOUS | Status: AC
  Filled 2016-03-22: qty 2

## 2016-03-22 MED ORDER — CELECOXIB 200 MG PO CAPS
400.0000 mg | ORAL_CAPSULE | ORAL | Status: AC
  Administered 2016-03-22: 400 mg via ORAL
  Filled 2016-03-22: qty 2

## 2016-03-22 MED ORDER — 0.9 % SODIUM CHLORIDE (POUR BTL) OPTIME
TOPICAL | Status: DC | PRN
  Administered 2016-03-22: 1000 mL

## 2016-03-22 MED ORDER — PROPOFOL 10 MG/ML IV BOLUS
INTRAVENOUS | Status: AC
  Filled 2016-03-22: qty 20

## 2016-03-22 MED ORDER — IOPAMIDOL (ISOVUE-300) INJECTION 61%
INTRAVENOUS | Status: DC | PRN
  Administered 2016-03-22: 7.5 mL via INTRAVENOUS

## 2016-03-22 MED ORDER — BUPIVACAINE LIPOSOME 1.3 % IJ SUSP
20.0000 mL | Freq: Once | INTRAMUSCULAR | Status: AC
  Administered 2016-03-22: 20 mL
  Filled 2016-03-22: qty 20

## 2016-03-22 MED ORDER — BUPIVACAINE-EPINEPHRINE 0.25% -1:200000 IJ SOLN
INTRAMUSCULAR | Status: DC | PRN
  Administered 2016-03-22: 50 mL

## 2016-03-22 MED ORDER — MIDAZOLAM HCL 5 MG/5ML IJ SOLN
INTRAMUSCULAR | Status: DC | PRN
  Administered 2016-03-22: 2 mg via INTRAVENOUS

## 2016-03-22 SURGICAL SUPPLY — 40 items
APPLIER CLIP 5 13 M/L LIGAMAX5 (MISCELLANEOUS) ×3
CABLE HIGH FREQUENCY MONO STRZ (ELECTRODE) ×3 IMPLANT
CHLORAPREP W/TINT 26ML (MISCELLANEOUS) ×3 IMPLANT
CLIP APPLIE 5 13 M/L LIGAMAX5 (MISCELLANEOUS) ×1 IMPLANT
COVER MAYO STAND STRL (DRAPES) ×3 IMPLANT
COVER SURGICAL LIGHT HANDLE (MISCELLANEOUS) IMPLANT
DECANTER SPIKE VIAL GLASS SM (MISCELLANEOUS) ×3 IMPLANT
DRAIN CHANNEL 19F RND (DRAIN) IMPLANT
DRAPE C-ARM 42X120 X-RAY (DRAPES) ×3 IMPLANT
DRAPE LAPAROSCOPIC ABDOMINAL (DRAPES) ×3 IMPLANT
DRAPE WARM FLUID 44X44 (DRAPE) ×3 IMPLANT
DRSG TEGADERM 4X4.75 (GAUZE/BANDAGES/DRESSINGS) ×3 IMPLANT
ELECT REM PT RETURN 9FT ADLT (ELECTROSURGICAL) ×3
ELECTRODE REM PT RTRN 9FT ADLT (ELECTROSURGICAL) ×1 IMPLANT
ENDOLOOP SUT PDS II  0 18 (SUTURE)
ENDOLOOP SUT PDS II 0 18 (SUTURE) IMPLANT
EVACUATOR SILICONE 100CC (DRAIN) IMPLANT
GAUZE SPONGE 2X2 8PLY STRL LF (GAUZE/BANDAGES/DRESSINGS) ×1 IMPLANT
GLOVE ECLIPSE 8.0 STRL XLNG CF (GLOVE) ×3 IMPLANT
GLOVE INDICATOR 8.0 STRL GRN (GLOVE) ×3 IMPLANT
GOWN STRL REUS W/TWL XL LVL3 (GOWN DISPOSABLE) ×9 IMPLANT
IRRIG SUCT STRYKERFLOW 2 WTIP (MISCELLANEOUS) ×3
IRRIGATION SUCT STRKRFLW 2 WTP (MISCELLANEOUS) ×1 IMPLANT
KIT BASIN OR (CUSTOM PROCEDURE TRAY) ×3 IMPLANT
PAD POSITIONING PINK XL (MISCELLANEOUS) ×6 IMPLANT
POSITIONER SURGICAL ARM (MISCELLANEOUS) ×3 IMPLANT
POUCH SPECIMEN RETRIEVAL 10MM (ENDOMECHANICALS) ×3 IMPLANT
SCISSORS LAP 5X35 DISP (ENDOMECHANICALS) ×3 IMPLANT
SET CHOLANGIOGRAPH MIX (MISCELLANEOUS) ×3 IMPLANT
SHEARS HARMONIC ACE PLUS 36CM (ENDOMECHANICALS) ×3 IMPLANT
SPONGE GAUZE 2X2 STER 10/PKG (GAUZE/BANDAGES/DRESSINGS) ×2
SUT MNCRL AB 4-0 PS2 18 (SUTURE) ×3 IMPLANT
SUT PDS AB 1 CT1 27 (SUTURE) ×9 IMPLANT
SYR 20CC LL (SYRINGE) ×3 IMPLANT
TOWEL OR 17X26 10 PK STRL BLUE (TOWEL DISPOSABLE) ×3 IMPLANT
TOWEL OR NON WOVEN STRL DISP B (DISPOSABLE) ×3 IMPLANT
TRAY LAPAROSCOPIC (CUSTOM PROCEDURE TRAY) ×3 IMPLANT
TROCAR BLADELESS OPT 5 100 (ENDOMECHANICALS) ×3 IMPLANT
TROCAR BLADELESS OPT 5 150 (ENDOMECHANICALS) ×3 IMPLANT
TUBING INSUF HEATED (TUBING) ×3 IMPLANT

## 2016-03-22 NOTE — Transfer of Care (Signed)
Immediate Anesthesia Transfer of Care Note  Patient: Jasmine Harvey  Procedure(s) Performed: Procedure(s): LAPAROSCOPIC CHOLECYSTECTOMY SINGLE SITE WITH INTRAOPERATIVE CHOLANGIOGRAM (N/A)  Patient Location: PACU  Anesthesia Type:General  Level of Consciousness: awake, alert , oriented and patient cooperative  Airway & Oxygen Therapy: Patient Spontanous Breathing and Patient connected to face mask oxygen  Post-op Assessment: Report given to RN, Post -op Vital signs reviewed and stable and Patient moving all extremities  Post vital signs: Reviewed and stable  Last Vitals:  Vitals:   03/22/16 1007  BP: 132/90  Pulse: 79  Resp: 16  Temp: 36.8 C    Last Pain:  Vitals:   03/22/16 1322  TempSrc:   PainSc: 0-No pain      Patients Stated Pain Goal: 4 (03/22/16 1322)  Complications: No apparent anesthesia complications

## 2016-03-22 NOTE — Discharge Instructions (Signed)
Anestesia general, adultos, cuidados posteriores (General Anesthesia, Adult, Care After) Siga estas instrucciones durante las prximas semanas. Estas indicaciones le proporcionan informacin acerca de cmo deber cuidarse despus del procedimiento. El mdico tambin podr darle instrucciones ms especficas. El tratamiento se ha planificado de acuerdo a las prcticas mdicas actuales, pero a veces se producen problemas. Comunquese con el mdico si tiene algn problema o tiene dudas despus del procedimiento. QU ESPERAR DESPUS DEL PROCEDIMIENTO Despus del procedimiento es habitual experimentar: Somnolencia. Nuseas y vmitos. INSTRUCCIONES PARA EL CUIDADO EN EL HOGAR Durante las primeras 24 horas luego de la anestesia general: Haga que una persona responsable se quede con usted. No conduzca un automvil. Si est solo, no viaje en transporte pblico. No beba alcohol. No tome medicamentos que no le haya recetado su mdico. No firme documentos importantes ni tome decisiones trascendentes. Puede reanudar su dieta y sus actividades normales segn le haya indicado el mdico. Cambie los vendajes (apsitos) tal como se le indic. Si tiene preguntas o se le presenta algn problema relacionado con la anestesia general, comunquese con el hospital y pida por el anestesista o anestesilogo de [place]. SOLICITE ATENCIN MDICA SI: Tiene nuseas y vmitos durante el da posterior a la anestesia. Le aparece una erupcin cutnea. SOLICITE ATENCIN MDICA DE INMEDIATO SI:  Tiene dificultad para respirar. Siente dolor en el pecho. Tiene algn problema alrgico.   Esta informacin no tiene como fin reemplazar el consejo del mdico. Asegrese de hacerle al mdico cualquier pregunta que tenga.   Document Released: 07/29/2005 Document Revised: 08/19/2014 Elsevier Interactive Patient Education 2016 Elsevier Inc. LAPAROSCOPIC SURGERY: POST OP  INSTRUCTIONS  ######################################################################  EAT Gradually transition to a high fiber diet with a fiber supplement over the next few weeks after discharge.  Start with a pureed / full liquid diet (see below)  WALK Walk an hour a day.  Control your pain to do that.    CONTROL PAIN Control pain so that you can walk, sleep, tolerate sneezing/coughing, go up/down stairs.  HAVE A BOWEL MOVEMENT DAILY Keep your bowels regular to avoid problems.  OK to try a laxative to override constipation.  OK to use an antidairrheal to slow down diarrhea.  Call if not better after 2 tries  CALL IF YOU HAVE PROBLEMS/CONCERNS Call if you are still struggling despite following these instructions. Call if you have concerns not answered by these instructions  ######################################################################    1. DIET: Follow a light bland diet the first 24 hours after arrival home, such as soup, liquids, crackers, etc.  Be sure to include lots of fluids daily.  Avoid fast food or heavy meals as your are more likely to get nauseated.  Eat a low fat the next few days after surgery.   2. Take your usually prescribed home medications unless otherwise directed. 3. PAIN CONTROL: a. Pain is best controlled by a usual combination of three different methods TOGETHER: i. Ice/Heat ii. Over the counter pain medication iii. Prescription pain medication b. Most patients will experience some swelling and bruising around the incisions.  Ice packs or heating pads (30-60 minutes up to 6 times a day) will help. Use ice for the first few days to help decrease swelling and bruising, then switch to heat to help relax tight/sore spots and speed recovery.  Some people prefer to use ice alone, heat alone, alternating between ice & heat.  Experiment to what works for you.  Swelling and bruising can take several weeks to resolve.   c. It is helpful  to take an  over-the-counter pain medication regularly for the first few weeks.  Choose one of the following that works best for you: i. Naproxen (Aleve, etc)  Two  tabs twice a day ii. Ibuprofen (Advil, etc) Three  tabs four times a day (every meal & bedtime) iii. Acetaminophen (Tylenol, etc) 500-650mg  four times a day (every meal & bedtime) d. A  prescription for pain medication (such as oxycodone, hydrocodone, etc) should be given to you upon discharge.  Take your pain medication as prescribed.  i. If you are having problems/concerns with the prescription medicine (does not control pain, nausea, vomiting, rash, itching, etc), please call us [phone] to see if we need to switch you to a different pain medicine that will work better for you and/or control your side effect better. ii. If you need a refill on your pain medication, please contact your pharmacy.  They will contact our office to request authorization. Prescriptions will not be filled after 5 pm or on week-ends. 4. Avoid getting constipated.  Between the surgery and the pain medications, it is common to experience some constipation.  Increasing fluid intake and taking a fiber supplement (such as Metamucil, Citrucel, FiberCon, MiraLax, etc) 1-2 times a day regularly will usually help prevent this problem from occurring.  A mild laxative (prune juice, Milk of Magnesia, MiraLax, etc) should be taken according to package directions if there are no bowel movements after 48 hours.   5. Watch out for diarrhea.  If you have many loose bowel movements, simplify your diet to bland foods & liquids for a few days.  Stop any stool softeners and decrease your fiber supplement.  Switching to mild anti-diarrheal medications (Kayopectate, Pepto Bismol) can help.  If this worsens or does not improve, please call us. 6. Wash / shower every day.  You may shower over the dressings as they are waterproof.  Continue to shower over incision(s) after the dressing is  off. 7. Remove your waterproof bandages 5 days after surgery.  You may leave the incision open to air.  You may replace a dressing/Band-Aid to cover the incision for comfort if you wish.  8. ACTIVITIES as tolerated:   a. You may resume regular (light) daily activities beginning the next day--such as daily self-care, walking, climbing stairs--gradually increasing activities as tolerated.  If you can walk 30 minutes without difficulty, it is safe to try more intense activity such as jogging, treadmill, bicycling, low-impact aerobics, swimming, etc. b. Save the most intensive and strenuous activity for last such as sit-ups, heavy lifting, contact sports, etc  Refrain from any heavy lifting or straining until you are off narcotics for pain control.   c. DO NOT PUSH THROUGH PAIN.  Let pain be your guide: If it hurts to do something, don't do it.  Pain is your body warning you to avoid that activity for another week until the pain goes down. d. You may drive when you are no longer taking prescription pain medication, you can comfortably wear a seatbelt, and you can safely maneuver your car and apply brakes. e. Bonita Quin may have sexual intercourse when it is comfortable.  9. FOLLOW UP in our office a. Please call [facility] at [phone] to set up an appointment to see your surgeon in the office for a follow-up appointment approximately 2-3 weeks after your surgery. b. Make sure that you call for this appointment the day you arrive home to insure a convenient appointment time. 10. IF YOU HAVE DISABILITY  OR FAMILY LEAVE FORMS, BRING THEM TO THE OFFICE FOR PROCESSING.  DO NOT GIVE THEM TO YOUR DOCTOR.   WHEN TO CALL us [phone]: 1. Poor pain control 2. Reactions / problems with new medications (rash/itching, nausea, etc)  3. Fever over 101.5 F (38.5 C) 4. Inability to urinate 5. Nausea and/or vomiting 6. Worsening swelling or bruising 7. Continued bleeding from incision. 8. Increased pain, redness, or  drainage from the incision   The clinic staff is available to answer your questions during regular business hours (8:30am-5pm).  Please dont hesitate to call and ask to speak to one of our nurses for clinical concerns.   If you have a medical emergency, go to the nearest emergency room or call 911.  A surgeon from [facility] is always on call at the [facility], [place] [address], [address], [place], [place]  [address] ? MAIN: [phone] ? TOLL FREE: [phone] ?  FAX [phone] [email or web address]  CIRUGA LAPAROSCPICA: INSTRUCCIONES POSTOP  ################################################## ####################  COMER Poco a poco la transicin a una dieta alta en fibra con un suplemento de fibra durante las prximas semanas despus del alta. Comience con una dieta lquida pur / completa (ver abajo)  CAMINAR Caminar una hora al da. Controla tu dolor para hacer eso.  CONTROL DEL DOLOR Controla el dolor para que puedas caminar, dormir, tolerar estornudos / toser, subir o Architectural technologist.  TIENE UN MOVIMIENTO BOWEL DIARIO Mantenga sus intestinos regulares para evitar problemas. Aceptar para probar un laxante para anular el estreimiento. OK para usar un antidairrheal para disminuir la diarrea. Llame si no mejor despus de 2 intentos  LLAME SI TIENE PROBLEMAS / PREOCUPACIONES Llame si todava est luchando a pesar de seguir estas instrucciones. Llame si tiene preocupaciones que no han sido contestadas por estas instrucciones  ################################################## ####################    DIETA: Siga una dieta suave ligera las primeras 24 horas despus de su llegada a casa, como sopa, lquidos, galletas, etc. Asegrese de incluir muchos lquidos diariamente. Evite los comidas rpidas o las comidas pesadas, ya que es ms probable que se sientan nuseas. Coma una grasa baja en los prximos das despus de  la [place]. Tome sus medicinas caseras generalmente prescritas a menos que se indique lo contrario. CONTROL DE DOLOR: El dolor se controla mejor mediante una combinacin habitual de tres mtodos [place]. JUNTOS: Hielo / Calor Medicamento para el dolor de venta libre Medicamentos para el dolor con receta La mayora de los pacientes experimentarn hinchazn y moretones alrededor de las incisiones. Los paquetes de hielo o cojines de calefaccin (30-60 minutos hasta 6 veces al da) Hotel manager. Use hielo durante los primeros das para ayudar a disminuir la hinchazn y los moretones, luego cambie al calor para ayudar a International aid/development worker los puntos apretados / doloridos y Hydrologist. Algunas Location manager hielo solo, Company secretary solo, alternando entre hielo y Airline pilot. Experimenta lo que funciona para ti. La hinchazn y los moretones pueden tomar varias semanas para Oncologist. Es til Set designer para Chief Technology Officer de venta libre regularmente durante las primeras semanas. Elija una de las siguientes opciones que mejor le convenga: Naproxen (Aleve, etc) Dos lengetas de 220mg  dos veces al da Ibuprofen (Advil, etc) Tres lengetas 200mg  cuatro veces al da (cada comida y hora de Teacher, music) Acetaminophen (Tylenol, etc) 500-650mg  cuatro veces al da (cada comida y hora de [place]) Debe administrarse una receta para medicamentos para el dolor (como oxicodona, hidrocodona, etc.)  en el momento del alta. Tome sus medicamentos para Chief Technology Officer segn lo prescrito. Si tiene problemas o preocupaciones con el medicamento recetado (no controla el dolor, nuseas, vmitos, sarpullido, picazn, etc.), llmenos al [phone] para ver si necesitamos cambiarlo por otro medicamento para el dolor Que funcionar mejor para usted y / o Chief Operating Officer mejor sus efectos secundarios. Si necesita un reabastecimiento para su medicacin para el dolor, comunquese con su farmacia. Entrarn en contacto con nuestra oficina  para solicitar autorizacin. Las recetas no sern llenadas despus de las 5 pm o los fines de [employer]. Evite el estreimiento. Entre la [place] y los medicamentos para Chief Technology Officer, es comn experimentar cierta constipacin. Aumentar la ingesta de lquidos y tomar un suplemento de fibra (como Metamucil, Citrucel, FiberCon, Welch, etc) 1-2 veces al da regularmente ayudar a prevenir este problema. Debe tomarse un laxante suave (jugo de [place] de magnesia, [place], etc.) de acuerdo con las instrucciones del paquete si no hay movimientos intestinales despus de 48 horas. Tenga cuidado con la diarrea. Si usted tiene muchos movimientos de intestino sueltos, simplifique su dieta para alimentos y lquidos blandos [address]. Detener cualquier ablandadores de heces y disminuir su suplemento de [place]. Cambiar a medicamentos antidiarreicos leves (Kayopectate, Pepto Bismol) puede ayudar. Si esto empeora o no mejora, por favor llmenos. Lavar / [employer]e. Puede ducharse sobre los apsitos ya que son impermeables. Contine la ducha sobre la incisin (s) despus de que el vendaje est apagado. Quite los vendajes impermeables 5 das despus de la [place]. Usted puede dejar la incisin abierta al aire. Usted puede reemplazar un vendaje / Band-Aid para cubrir la incisin para mayor comodidad si lo desea. ACTIVIDADES segn lo tolerado: Usted puede reanudar las actividades diarias regulares (ligeras) que Product manager da siguiente -como el autocuidado diario, Advertising account planner, subir escaleras- aumentar gradualmente las actividades segn lo tolerado. Si puede caminar 30 minutos sin dificultad, es seguro intentar una actividad ms intensa como correr, andar [employer], andar en bicicleta, aerbic de bajo impacto, nadar, etc. Excepto la actividad ms intensa y agotadora para el ltimo tal como abdominales, levantamiento pesado, deportes del contacto, el etc Abstenerse de cualquier levantamiento pesado o  esfuerzo hasta que usted est apagado los narcticos para el control del dolor. NO EMPUJE A TRAVS DEL DOLOR. Deje que el dolor sea su gua: Si le duele hacer algo, no lo haga. El dolor es su cuerpo advirtiendo que debe evitar esa actividad durante otra semana hasta que el dolor disminuya. Usted puede conducir cuando ya no est tomando medicacin para Chief Technology Officer con Engineer, drilling, puede usar cmodamente el cinturn de seguridad, y Geophysical data processor con seguridad su coche y Contractor los frenos. Usted puede tener relaciones sexuales cuando es cmodo. SEGUIMIENTO en nuestra oficina Por favor    Colecistectoma laparoscpica, cuidados posteriores (Laparoscopic Cholecystectomy, Care After) Siga estas instrucciones durante las prximas semanas. Estas indicaciones le proporcionan informacin acerca de cmo deber cuidarse despus del procedimiento. El mdico tambin podr darle instrucciones ms especficas. El tratamiento ha sido planificado segn las prcticas mdicas actuales, pero en algunos casos pueden ocurrir problemas. Comunquese con el mdico si tiene algn problema o dudas despus del procedimiento. QU ESPERAR DESPUS DEL PROCEDIMIENTO Despus del procedimiento, es comn tener los siguientes sntomas:  Dolor en los lugares de las incisiones. Le darn analgsicos para Human resources officer.  Nuseas o vmitos leves. Estos sntomas deberan mejorar despus de las primeras 24horas.  Meteorismo y Designer, fashion/clothing en el hombro debido al gas que se  us durante el procedimiento. Estos sntomas mejorarn despus de las primeras 24horas. INSTRUCCIONES PARA EL CUIDADO EN EL HOGAR Cuidado de la incisin  Siga las indicaciones del mdico acerca del cuidado de las incisiones. Haga lo siguiente:  BorgWarnerLvese las manos con agua y jabn antes de Multimedia programmercambiar las vendas (vendaje). Use desinfectante para manos si no dispone de [place] y [place].  Cambie el vendaje como se lo haya indicado el mdico.  No retire los puntos  (suturas), el QUALCOMMadhesivo para la piel o las tiras [place] Parkadhesivas. Es posible que estos deban quedar puestos en la piel durante 2semanas o ms tiempo. Si los bordes de las tiras [address]adhesivas empiezan a despegarse y Scientific laboratory technicianenroscarse, puede recortar los que estn sueltos. No retire las tiras Agilent Technologiesadhesivas por completo a menos que el mdico se lo indique.  No tome baos de inmersin, no nade ni use el jacuzzi hasta que el mdico lo autorice. Pregntele al mdico si puede ducharse. Delle Reiningal vez solo le permitan tomar baos de [place]esponja. Instrucciones generales  Baxter Internationalome los medicamentos de venta libre y los recetados solamente como se lo haya indicado el mdico.  No conduzca ni opere maquinaria pesada mientras toma analgsicos recetados.  Reanude su dieta normal como se lo haya indicado el mdico.  No levante ningn objeto que pese ms de 10libras (4,5kg).  No practique deportes de contacto durante unasemana o hasta que el mdico lo autorice. SOLICITE ATENCIN MDICA SI:   Tiene enrojecimiento, hinchazn o Art therapistdolor en el lugar de la incisin.  Observa lquido, sangre o pus que emanan de la incisin.  Percibe que sale mal olor de la zona de las incisiones.  Las incisiones quirrgicas se abren.  Tiene fiebre. SOLICITE ATENCIN MDICA DE INMEDIATO SI:  Le aparece una erupcin cutnea.  Tiene dificultad para respirar.  Siente dolor en el pecho.  Siente ms dolor en los hombros (en las zonas donde van los breteles).  Se desmaya o tiene episodios de Barnes & Noblemareos mientras est de pie.  Siente un dolor intenso en el abdomen.  Tiene nuseas o vmitos durante ms de Civil engineer, contractingun da.   Esta informacin no tiene Theme park managercomo fin reemplazar el consejo del mdico. Asegrese de hacerle al mdico cualquier pregunta que tenga.   Document Released: 03/11/2011 Document Revised: 04/19/2015 Elsevier Interactive Patient Education [employer]2016 Elsevier Inc.

## 2016-03-22 NOTE — Progress Notes (Signed)
Interpreter at bedside.

## 2016-03-22 NOTE — Anesthesia Postprocedure Evaluation (Signed)
Anesthesia Post Note  Patient: Jasmine Harvey  Procedure(s) Performed: Procedure(s) (LRB): LAPAROSCOPIC CHOLECYSTECTOMY SINGLE SITE WITH INTRAOPERATIVE CHOLANGIOGRAM (N/A)  Patient location during evaluation: PACU Anesthesia Type: General Level of consciousness: awake and alert Pain management: pain level controlled Vital Signs Assessment: post-procedure vital signs reviewed and stable Respiratory status: spontaneous breathing, nonlabored ventilation and respiratory function stable Cardiovascular status: blood pressure returned to baseline and stable Postop Assessment: no signs of nausea or vomiting Anesthetic complications: no    Last Vitals:  Vitals:   03/22/16 1726 03/22/16 1736  BP:  137/76  Pulse: 81   Resp: 13 16  Temp: 36.4 C 36.2 C    Last Pain:  Vitals:   03/22/16 1322  TempSrc:   PainSc: 0-No pain                 Maleak Brazzel,W. EDMOND

## 2016-03-22 NOTE — Progress Notes (Signed)
Patient and family informed with help of interpretor that surgery is delayed. She indicates via interpretor that she understands. Emotional support given.

## 2016-03-22 NOTE — H&P (Signed)
Daryl EasternFlora Gonzalez Perez 02/06/2016 8:36 AM Location: [facility] Patient #: [id number] DOB: 1966-01-10 Single / Language: Spanish / Race: White Female  Patient Care Team: Asiyah Mayra ReelZahra Mikell, MD as PCP - General (Family Medicine) Karie SodaSteven Derrisha Foos, MD as Consulting Physician (General Surgery) Asiyah Mayra ReelZahra Mikell, MD as Resident (Family Medicine)   History of Present Illness  The patient is a 50 year old female who presents for evaluation of gall stones. Note for "Gall stones": Patient sent for surgical consultation by [name], MD, for concern of sympathetic gallstones.  Patient comes today with her daughter. She speaks no [place]. We worked to set up an Equities traderinterpreter. Patient has had episodes of intermittent abdominal pain. Usually related to food. She's had attacks and she was pregnant over a decade ago. She is gone to the emergency room a few times. Workup showed gallstones. Rest of the etiology seems less suspicious.   Because of financial issues, she's tried to hold off on doing anything too aggressive. However she's having more frequent attacks. Worse with protein like chili or beef. Usually gets diffuse crampy abdominal pain. Seems to be worse in the right upper quadrant. We'll radiate to the back. She often will get full and bloated with this. Not particularly nauseated or vomiting bowel. The system with heartburn or reflux. She has some mild constipation which fruit juices and laxatives help control her. Usually moves her bowels at least every other day. She is moderately active. However she does have some ankle knee in elbow pains that limited her activity a little bit. She does not smoke. No history of coronary disease. She's never had abdominal surgery. No severe bouts of constipation or diarrhea. Not really tried much in the way medications to help deal with this. Seemed to start after her first pregnancy.  No personal nor family history of GI/colon  cancer, inflammatory bowel disease, irritable bowel syndrome, allergy such as Celiac Sprue, dietary/dairy problems, colitis, ulcers nor gastritis. No recent sick contacts/gastroenteritis. No travel outside the country. No changes in diet. No dysphagia to solids or liquids. No significant heartburn or reflux. No hematochezia, hematemesis, coffee ground emesis. No evidence of prior gastric/peptic ulceration.   Allergies ([name], CMA; 02/06/2016 8:37 AM) No Known Drug Allergies06/27/2017  Medication History ([name], CMA; 02/06/2016 8:36 AM) No Current Medications Medications Reconciled  Vitals ([name] CMA; 02/06/2016 8:36 AM) 02/06/2016 8:36 AM Weight: 164 lb Height: 62in Body Surface Area: 1.76 m Body Mass Index: 30 kg/m  Temp.: 67F(Temporal)  Pulse: 75 (Regular)  BP: 130/70 (Sitting, Left Arm, Standard)  LMP 12/14/2015        Physical Exam [name](Jadavion Spoelstra C. Charvis Lightner MD; 02/06/2016 9:27 AM) General Mental Status-Alert. General Appearance-Not in acute distress, Not Sickly. Orientation-Oriented X3. Hydration-Well hydrated. Voice-Normal.  Integumentary Global Assessment Upon inspection and palpation of skin surfaces of the - Axillae: non-tender, no inflammation or ulceration, no drainage. and Distribution of scalp and body hair is normal. General Characteristics Temperature - normal warmth is noted.  Head and Neck Head-normocephalic, atraumatic with no lesions or palpable masses. Face Global Assessment - atraumatic, no absence of expression. Neck Global Assessment - no abnormal movements, no bruit auscultated on the right, no bruit auscultated on the left, no decreased range of motion, non-tender. Trachea-midline. Thyroid Gland Characteristics - non-tender.  Eye Eyeball - Left-Extraocular movements intact, No Nystagmus. Eyeball - Right-Extraocular movements intact, No Nystagmus. Cornea - Left-No Hazy. Cornea - Right-No  Hazy. Sclera/Conjunctiva - Left-No scleral icterus, No Discharge. Sclera/Conjunctiva - Right-No scleral icterus,  No Discharge. Pupil - Left-Direct reaction to light normal. Pupil - Right-Direct reaction to light normal.  ENMT Ears Pinna - Left - no drainage observed, no generalized tenderness observed. Right - no drainage observed, no generalized tenderness observed. Nose and Sinuses External Inspection of the Nose - no destructive lesion observed. Inspection of the nares - Left - quiet respiration. Right - quiet respiration. Mouth and Throat Lips - Upper Lip - no fissures observed, no pallor noted. Lower Lip - no fissures observed, no pallor noted. Nasopharynx - no discharge present. Oral Cavity/Oropharynx - Tongue - no dryness observed. Oral Mucosa - no cyanosis observed. Hypopharynx - no evidence of airway distress observed.  Chest and Lung Exam Inspection Movements - Normal and Symmetrical. Accessory muscles - No use of accessory muscles in breathing. Palpation Palpation of the chest reveals - Non-tender. Auscultation Breath sounds - Normal and Clear.  Cardiovascular Auscultation Rhythm - Regular. Murmurs & Other Heart Sounds - Auscultation of the heart reveals - No Murmurs and No Systolic Clicks.  Abdomen Inspection Inspection of the abdomen reveals - No Visible peristalsis and No Abnormal pulsations. Umbilicus - No Bleeding, No Urine drainage. Palpation/Percussion Palpation and Percussion of the abdomen reveal - Soft, Non Tender, No Rebound tenderness, No Rigidity (guarding) and No Cutaneous hyperesthesia. Note: Mild diffuse abdominal discomfort. Most sensitive in right upper quadrant. No true Murphy sign. No diastases recti. No umbilical hernia   Female Genitourinary Sexual Maturity Tanner 5 - Adult hair pattern. Note: Nontender. No inguinal hernias. No major vaginal bleeding nor foul discharge   Peripheral Vascular Upper Extremity Inspection - Left - No  Cyanotic nailbeds, Not Ischemic. Right - No Cyanotic nailbeds, Not Ischemic.  Neurologic Neurologic evaluation reveals -normal attention span and ability to concentrate, able to name objects and repeat phrases. Appropriate fund of knowledge , normal sensation and normal coordination. Mental Status Affect - not angry, not paranoid. Cranial Nerves-Normal Bilaterally. Gait-Normal.  Neuropsychiatric Mental status exam performed with findings of-able to articulate well with normal speech/language, rate, volume and coherence, thought content normal with ability to perform basic computations and apply abstract reasoning and no evidence of hallucinations, delusions, obsessions or homicidal/suicidal ideation.  Musculoskeletal Global Assessment Spine, Ribs and Pelvis - no instability, subluxation or laxity. Right Upper Extremity - no instability, subluxation or laxity.  Lymphatic Head & Neck  General Head & Neck Lymphatics: Bilateral - Description - No Localized lymphadenopathy. Axillary  General Axillary Region: Bilateral - Description - No Localized lymphadenopathy. Femoral & Inguinal  Generalized Femoral & Inguinal Lymphatics: Left - Description - No Localized lymphadenopathy. Right - Description - No Localized lymphadenopathy.    Assessment & Plan  CHRONIC CHOLECYSTITIS WITH CALCULUS (K80.10) Impression: Rather classic story biliary colic. Also some component of constipation as well.  I think she would benefit from cholecystectomy. The rest of the differential diagnosis seems unlikely. I discussed at length with the patient & her daughter with the help of a Spanish interpreter. Techniques risk benefits alternatives discussed. Questions answered. They wish to proceed. We'll work to coordinate a convenient time.  A major concern for her is the financials. I think she is warned Flossie Buffy in [place] some financial help. I think that's big reason she's delayed surgery for the past  decade. Hopefully we can find a way to do this for her  I have re-reviewed the the patient's records, history, medications, and allergies.  I have re-examined the patient.  I again discussed intraoperative plans and goals of post-operative recovery.  The patient  agrees to proceed.    Current Plans You are being scheduled for surgery - Our schedulers will call you.  You should hear from our office's scheduling department within 5 working days about the location, date, and time of surgery. We try to make accommodations for patient's preferences in scheduling surgery, but sometimes the OR schedule or the surgeon's schedule prevents [place] from making those accommodations.  If you have not heard from our office [phone]) in 5 working days, call the office and ask for your surgeon's nurse.  If you have other questions about your diagnosis, plan, or surgery, call the office and ask for your surgeon's nurse.  The anatomy & physiology of hepatobiliary & pancreatic function was discussed. The pathophysiology of gallbladder dysfunction was discussed. Natural history risks without surgery was discussed. I feel the risks of no intervention will lead to serious problems that outweigh the operative risks; therefore, I recommended cholecystectomy to remove the pathology. I explained laparoscopic techniques with possible need for an open approach. Probable cholangiogram to evaluate the bilary tract was explained as well.  Risks such as bleeding, infection, abscess, leak, injury to other organs, need for further treatment, heart attack, death, and other risks were discussed. I noted a good likelihood this will help address the problem. Possibility that this will not correct all abdominal symptoms was explained. Goals of post-operative recovery were discussed as well. We will work to minimize complications. An educational handout further explaining the pathology and treatment options was given as well. Questions  were answered. The patient expresses understanding & wishes to proceed with surgery.  Pt Education - Pamphlet Given - Laparoscopic Gallbladder Surgery: discussed with patient and provided information. Written instructions provided Pt Education - [facility] Laparosopic Post Op HCI (Raahim Shartzer) Pt Education - [facility] Good Bowel Health (Tommy Goostree) Pt Education - Laparoscopic Cholecystectomy: gallbladder  [name], M.D., F.A.C.S. Gastrointestinal and Minimally Invasive Surgery [facility] [address], [address] [place], [place] [address] [phone] Main / Paging

## 2016-03-22 NOTE — Op Note (Signed)
03/22/2016  4:38 PM  PATIENT:  Jasmine Harvey  50 y.o. female  Patient Care Team: Asiyah Mayra ReelZahra Mikell, MD as PCP - General (Family Medicine) Karie SodaSteven Kailyn Vanderslice, MD as Consulting Physician (General Surgery) Asiyah Mayra ReelZahra Mikell, MD as Resident (Family Medicine)  PRE-OPERATIVE DIAGNOSIS:  Chronic cholecystitis  POST-OPERATIVE DIAGNOSIS:  Chronic cholecystitis  PROCEDURE:  Procedure(s): LAPAROSCOPIC CHOLECYSTECTOMY SINGLE SITE WITH INTRAOPERATIVE CHOLANGIOGRAM  SURGEON:  Surgeon(s): Karie SodaSteven Delvin Hedeen, MD  ASSISTANT: RN   ANESTHESIA:   local and general  EBL:  Total I/O In: 1000 [I.V.:1000] Out: 20 [Blood:20]  Delay start of Pharmacological VTE agent (>24hrs) due to surgical blood loss or risk of bleeding:  no  DRAINS: none   SPECIMEN:  Source of Specimen:  Gallbladder   DISPOSITION OF SPECIMEN:  PATHOLOGY  COUNTS:  YES  PLAN OF CARE: Discharge to home after PACU  PATIENT DISPOSITION:  PACU - hemodynamically stable.  INDICATION: Pleasant patient with classic episode of bili or colic and gallstones.  I recommended considering cholecystectomy  The anatomy & physiology of hepatobiliary & pancreatic function was discussed.  The pathophysiology of gallbladder dysfunction was discussed.  Natural history risks without surgery was discussed.   I feel the risks of no intervention will lead to serious problems that outweigh the operative risks; therefore, I recommended cholecystectomy to remove the pathology.  I explained laparoscopic techniques with possible need for an open approach.  Probable cholangiogram to evaluate the bilary tract was explained as well.    Risks such as bleeding, infection, abscess, leak, injury to other organs, need for further treatment, heart attack, death, and other risks were discussed.  I noted a good likelihood this will help address the problem.  Possibility that this will not correct all abdominal symptoms was explained.  Goals of post-operative recovery  were discussed as well.  We will work to minimize complications.  An educational handout further explaining the pathology and treatment options was given as well.  Questions were answered.  The patient expresses understanding & wishes to proceed with surgery.   OR FINDINGS: Gallbladder wall thickening with rockhard feeling consistent with chronically impacted stones.  Very thickened and tortuous infundibulum tapering off to a very narrow cystic duct  Cholangiogram with classic biliary anatomy without any obstruction or other abnormality  DESCRIPTION:   The patient was identified & brought in the operating room. The patient was positioned supine with arms tucked. SCDs were active during the entire case. The patient underwent general anesthesia without any difficulty.  The abdomen was prepped and draped in a sterile fashion. A Surgical Timeout confirmed our plan.  I made a transverse curvilinear incision through the superior umbilical fold.  I placed a 5mm long port through the supraumbilical fascia using a modified Hassan cutdown technique. I began carbon dioxide insufflation. Camera inspection revealed no injury. There were no adhesions to the anterior abdominal wall supraumbilically.  I proceeded to continue with single site technique. I placed a #5 port in left upper aspect of the wound. I placed a 5 mm atraumatic grasper in the right inferior aspect of the wound.  I turned attention to the right upper quadrant.  Gallbladder was very thickened and nearly completely replaced with a thickened heart paced of stones The gallbladder fundus was elevated cephalad. I freed the peritoneal coverings between the gallbladder and the liver on the posteriolateral and anteriomedial walls. I alternated between Harmonic & blunt Maryland dissection to help get a good critical view of the cystic artery and cystic duct. I did  further dissection to free 90% of the gallbladder off the liver bed to get a good critical view  of the infundibulum and cystic duct.  The infundibulum was corkscrewed fold upon itself that took some time to straighten it out.  I mobilized the cystic artery; and, after getting a good 360 view, ligated the cystic artery using the Harmonic ultrasonic dissection. I skeletonized the cystic duct as it came down to the cystic duct/common bile duct junction..  I placed a clip on the infundibulum. I did a partial cystic duct-otomy and ensured patency. I placed a 5 [place] cholangiocatheter through a puncture site at the right subcostal ridge of the abdominal wall and directed it into the cystic duct.  We ran a cholangiogram with dilute radio-opaque contrast and continuous fluoroscopy.  The initial run had poor flow.  I repositioned the catheter as it it kinked.  Repeat cholangiography had a very easy flow.  Contrast flowed from a side branch consistent with cystic duct cannulization. Contrast flowed up the common hepatic duct into the right and left intrahepatic chains out to secondary radicals. Contrast flowed down the common bile duct easily across the normal ampulla into the duodenum.  This was consistent with a normal cholangiogram.  I removed the cholangiocatheter. I placed clips on the cystic duct x4.   I completed cystic duct transection. I freed the gallbladder from its remaining attachments to the liver. I ensured hemostasis on the gallbladder fossa of the liver and elsewhere. I inspected the rest of the abdomen & detected no injury nor bleeding elsewhere.  I removed the gallbladder out the supraumbilical fascia.   I have to place inside an Endo Catch bag.  I had opened up the fascial defect 3 cm to get it out as it was rockhard and replaced with stones and a very thick paste.  I closed the fascia transversely using #1 PDS interrupted stitches. I closed the skin using 4-0 monocryl stitch.  Sterile dressing was applied. The patient was extubated & arrived in the PACU in stable condition..  I had  discussed postoperative care with the patient in the holding area.  I am about to locate the patient's family and discuss operative findings and postoperative goals / instructions.  Instructions are written in the chart as well.  [name], M.D., F.A.C.S. Gastrointestinal and Minimally Invasive Surgery [facility] [address], [address] [place], [place] [address] [phone] Main / Paging

## 2016-03-22 NOTE — Anesthesia Procedure Notes (Signed)
Procedure Name: Intubation Date/Time: 03/22/2016 3:22 PM Performed by: Jarvis NewcomerARMISTEAD, Matthewjames Petrasek A Pre-anesthesia Checklist: Patient identified, Timeout performed, Emergency Drugs available, Suction available and Patient being monitored Patient Re-evaluated:Patient Re-evaluated prior to inductionOxygen Delivery Method: Circle system utilized Preoxygenation: Pre-oxygenation with 100% oxygen Intubation Type: IV induction Ventilation: Mask ventilation without difficulty Laryngoscope Size: Mac and 4 Grade View: Grade I Tube type: Oral Tube size: 7.0 mm Number of attempts: 1 Airway Equipment and Method: Stylet Placement Confirmation: ETT inserted through vocal cords under direct vision,  positive ETCO2 and breath sounds checked- equal and bilateral Secured at: 21 cm Tube secured with: Tape Dental Injury: Teeth and Oropharynx as per pre-operative assessment

## 2016-03-22 NOTE — Interval H&P Note (Signed)
History and Physical Interval Note:  03/22/2016 1:45 PM  Jasmine Harvey  has presented today for surgery, with the diagnosis of Chronic cholecystitis  The various methods of treatment have been discussed with the patient and family. After consideration of risks, benefits and other options for treatment, the patient has consented to  Procedure(s): LAPAROSCOPIC CHOLECYSTECTOMY SINGLE SITE WITH INTRAOPERATIVE CHOLANGIOGRAM (N/A) as a surgical intervention .  The patient's history has been reviewed, patient examined, no change in status, stable for surgery.  I have reviewed the patient's chart and labs.  Questions were answered to the patient's satisfaction.     Delmy Holdren C.

## 2016-03-22 NOTE — Anesthesia Preprocedure Evaluation (Addendum)
Anesthesia Evaluation  Patient identified by MRN, date of birth, ID band Patient awake    Reviewed: Allergy & Precautions, H&P , NPO status , Patient's Chart, lab work & pertinent test results  Airway Mallampati: III  TM Distance: >3 FB Neck ROM: Full    Dental no notable dental hx. (+) Teeth Intact, Dental Advisory Given   Pulmonary neg pulmonary ROS,    Pulmonary exam normal breath sounds clear to auscultation       Cardiovascular negative cardio ROS   Rhythm:Regular Rate:Normal     Neuro/Psych negative neurological ROS  negative psych ROS   GI/Hepatic negative GI ROS, Neg liver ROS,   Endo/Other  negative endocrine ROS  Renal/GU negative Renal ROS  negative genitourinary   Musculoskeletal   Abdominal   Peds  Hematology negative hematology ROS (+)   Anesthesia Other Findings   Reproductive/Obstetrics negative OB ROS                            Anesthesia Physical Anesthesia Plan  ASA: II  Anesthesia Plan: General   Post-op Pain Management:    Induction: Intravenous  Airway Management Planned: Oral ETT  Additional Equipment:   Intra-op Plan:   Post-operative Plan: Extubation in OR  Informed Consent: I have reviewed the patients History and Physical, chart, labs and discussed the procedure including the risks, benefits and alternatives for the proposed anesthesia with the patient or authorized representative who has indicated his/her understanding and acceptance.   Dental advisory given  Plan Discussed with: CRNA  Anesthesia Plan Comments:         Anesthesia Quick Evaluation  

## 2016-03-25 ENCOUNTER — Encounter (HOSPITAL_COMMUNITY): Payer: Self-pay | Admitting: Surgery

## 2016-06-03 ENCOUNTER — Ambulatory Visit: Payer: Self-pay

## 2017-01-13 ENCOUNTER — Other Ambulatory Visit: Payer: Self-pay | Admitting: Obstetrics and Gynecology

## 2017-01-13 DIAGNOSIS — Z1231 Encounter for screening mammogram for malignant neoplasm of breast: Secondary | ICD-10-CM

## 2017-01-23 ENCOUNTER — Ambulatory Visit
Admission: RE | Admit: 2017-01-23 | Discharge: 2017-01-23 | Disposition: A | Discharge status: Home / Self Care | Payer: No Typology Code available for payment source | Source: Ambulatory Visit | Attending: Obstetrics and Gynecology | Admitting: Obstetrics and Gynecology

## 2017-01-23 ENCOUNTER — Ambulatory Visit (HOSPITAL_COMMUNITY)
Admission: RE | Admit: 2017-01-23 | Discharge: 2017-01-23 | Disposition: A | Discharge status: Home / Self Care | Payer: Self-pay | Source: Ambulatory Visit | Attending: Obstetrics and Gynecology | Admitting: Obstetrics and Gynecology

## 2017-01-23 ENCOUNTER — Encounter (HOSPITAL_COMMUNITY): Payer: Self-pay | Admitting: *Deleted

## 2017-01-23 VITALS — BP 116/74 | Ht 62.0 in | Wt 160.8 lb

## 2017-01-23 DIAGNOSIS — Z01419 Encounter for gynecological examination (general) (routine) without abnormal findings: Secondary | ICD-10-CM

## 2017-01-23 DIAGNOSIS — Z1231 Encounter for screening mammogram for malignant neoplasm of breast: Secondary | ICD-10-CM

## 2017-01-23 NOTE — Patient Instructions (Addendum)
Explained breast self awareness with Jasmine Harvey. Let patient know [facility] will cover Pap smears and HPV typing every 5 years unless has a history of abnormal Pap smears. [facility]. Appointment scheduled for Thursday, January 23, 2017 at 1240. Let patient know will follow up with her within the next couple weeks with results of Pap smear by phone. Informed patient that the Breast Center will follow up with her within the next couple of weeks with results of mammogram by letter or phone. Jasmine Harvey verbalized understanding.  Srihith Aquilino, Kathaleen Maserhristine Poll, RN 2:58 PM

## 2017-01-23 NOTE — Progress Notes (Signed)
Complaints of left breast lump x 2 years.  Pap Smear: Pap smear completed today. [facility] and normal per patient. Per patient has no history of an abnormal Pap smear. No Pap smear results in EPIC.  Physical exam: Breasts Breasts symmetrical. No skin abnormalities bilateral breasts. No nipple retraction bilateral breasts. No nipple discharge bilateral breasts. No lymphadenopathy. No lumps palpated bilateral breasts. No lumps palpated in patients area of concern. No complaints of pain or tenderness on exam. [facility]. Appointment scheduled for Thursday, January 23, 2017 at 1240.  Pelvic/Bimanual   Ext Genitalia No lesions, no swelling and no discharge observed on external genitalia.         Vagina Vagina pink and normal texture. No lesions or discharge observed in vagina.          Cervix Cervix is present. Cervix pink and of normal texture. No discharge observed.     Uterus Uterus is present and palpable. Uterus in normal position and normal size.        Adnexae Bilateral ovaries present and palpable. No tenderness on palpation.          Rectovaginal No rectal exam completed today since patient had no rectal complaints. No skin abnormalities observed on exam.    Smoking History: Patient has never smoked.  Patient Navigation: Patient education provided. [facility] services [facility]. Spanish interpreter provided.  Used Spanish interpreter [employer]ur[employer] CompanyBlanca Lindner from [employer].

## 2017-01-24 ENCOUNTER — Encounter (HOSPITAL_COMMUNITY): Payer: Self-pay | Admitting: *Deleted

## 2017-01-27 LAB — CYTOLOGY - PAP
DIAGNOSIS: NEGATIVE
HPV (WINDOPATH): NOT DETECTED

## 2017-02-03 ENCOUNTER — Encounter (HOSPITAL_COMMUNITY): Payer: Self-pay | Admitting: *Deleted

## 2017-02-03 NOTE — Progress Notes (Signed)
Letter mailed to patient with negative pap smear results. Next pap smear due in five years.  

## 2017-06-02 ENCOUNTER — Encounter (HOSPITAL_COMMUNITY): Payer: Self-pay

## 2017-08-20 ENCOUNTER — Encounter (HOSPITAL_COMMUNITY): Payer: Self-pay

## 2018-03-13 ENCOUNTER — Other Ambulatory Visit: Payer: Self-pay | Admitting: Obstetrics and Gynecology

## 2018-03-13 DIAGNOSIS — Z1231 Encounter for screening mammogram for malignant neoplasm of breast: Secondary | ICD-10-CM

## 2018-05-07 ENCOUNTER — Ambulatory Visit
Admission: RE | Admit: 2018-05-07 | Discharge: 2018-05-07 | Disposition: A | Discharge status: Home / Self Care | Payer: No Typology Code available for payment source | Source: Ambulatory Visit | Attending: Obstetrics and Gynecology | Admitting: Obstetrics and Gynecology

## 2018-05-07 ENCOUNTER — Ambulatory Visit (HOSPITAL_COMMUNITY)
Admission: RE | Admit: 2018-05-07 | Discharge: 2018-05-07 | Disposition: A | Discharge status: Home / Self Care | Payer: Self-pay | Source: Ambulatory Visit | Attending: Obstetrics and Gynecology | Admitting: Obstetrics and Gynecology

## 2018-05-07 ENCOUNTER — Encounter (HOSPITAL_COMMUNITY): Payer: Self-pay

## 2018-05-07 VITALS — BP 114/72 | Wt 164.2 lb

## 2018-05-07 DIAGNOSIS — Z1231 Encounter for screening mammogram for malignant neoplasm of breast: Secondary | ICD-10-CM

## 2018-05-07 DIAGNOSIS — Z1239 Encounter for other screening for malignant neoplasm of breast: Secondary | ICD-10-CM

## 2018-05-07 NOTE — Patient Instructions (Signed)
Explained breast self awareness with Daryl Eastern. Patient did not need a Pap smear today due to last Pap smear and HPV typing was 01/23/2017. Let her know [facility] will cover Pap smears and HPV typing every 5 years unless has a history of abnormal Pap smears. Referred patient to the [facility] for a screening mammogram. Appointment scheduled for Thursday, May 07, 2018 at 1510. Let patient know the Breast Center will follow up with her within the next couple weeks with results of mammogram by letter or phone. Daryl Eastern verbalized understanding.  Theo Krumholz, Kathaleen Maser, RN 2:36 PM

## 2018-05-07 NOTE — Progress Notes (Signed)
No complaints today.   Pap Smear: Pap smear not completed today. Last Pap smear was 01/23/2017 at [facility] and normal with negative HPV. Per patient has no history of an abnormal Pap smear. Last Pap smear result is in Epic.  Physical exam: Breasts Breasts symmetrical. No skin abnormalities bilateral breasts. No nipple retraction bilateral breasts. No nipple discharge bilateral breasts. No lymphadenopathy. No lumps palpated bilateral breasts. No complaints of pain or tenderness on exam. Referred patient to the [facility] for a screening mammogram. Appointment scheduled for Thursday, May 07, 2018 at 1510.        Pelvic/Bimanual No Pap smear completed today since last Pap smear and HPV typing was 01/23/2017. Pap smear not indicated per BCCCP guidelines.   Smoking History: Patient has never smoked.  Patient Navigation: Patient education provided. Access to services provided for patient through [facility] program. Spanish interpreter provided.   Breast and Cervical Cancer Risk Assessment: Patient has no family history of breast cancer, known genetic mutations, or radiation treatment to the chest before age 26. Patient has no history of cervical dysplasia, immunocompromised, or DES exposure in-utero.  Risk Assessment    Risk Scores      05/07/2018   Last edited by: Lynnell Dike, LPN   5-year risk: 0.5 %   Lifetime risk: 5.3 %         Used Spanish interpreter Natale Lay from [place].

## 2018-05-20 ENCOUNTER — Encounter (HOSPITAL_COMMUNITY): Payer: Self-pay | Admitting: *Deleted

## 2018-08-11 ENCOUNTER — Encounter (HOSPITAL_COMMUNITY): Payer: Self-pay | Admitting: *Deleted

## 2019-05-28 ENCOUNTER — Other Ambulatory Visit: Payer: Self-pay

## 2019-05-28 ENCOUNTER — Ambulatory Visit (INDEPENDENT_AMBULATORY_CARE_PROVIDER_SITE_OTHER): Payer: Self-pay | Admitting: Student in an Organized Health Care Education/Training Program

## 2019-05-28 VITALS — BP 130/80 | HR 73 | Ht 62.0 in | Wt 159.6 lb

## 2019-05-28 DIAGNOSIS — E6609 Other obesity due to excess calories: Secondary | ICD-10-CM

## 2019-05-28 DIAGNOSIS — M130 Polyarthritis, unspecified: Secondary | ICD-10-CM

## 2019-05-28 DIAGNOSIS — K121 Other forms of stomatitis: Secondary | ICD-10-CM

## 2019-05-28 DIAGNOSIS — R739 Hyperglycemia, unspecified: Secondary | ICD-10-CM

## 2019-05-28 LAB — GLUCOSE, POCT (MANUAL RESULT ENTRY): POC Glucose: 104 mg/dl — AB (ref 70–99)

## 2019-05-28 MED ORDER — IBUPROFEN 600 MG PO TABS: 600.0000 mg | ORAL_TABLET | Freq: Three times a day (TID) | ORAL | 0 refills | Status: DC | PRN

## 2019-05-28 MED ORDER — MAGIC MOUTHWASH W/LIDOCAINE: 5.0000 mL | Freq: Three times a day (TID) | ORAL | 0 refills | Status: DC | PRN

## 2019-05-28 NOTE — Assessment & Plan Note (Signed)
Recommended patient try over-the-counter Tylenol and or NSAIDs as needed. Return if no improvement.

## 2019-05-28 NOTE — Progress Notes (Signed)
Subjective:    Patient ID: Jasmine Harvey, female    DOB: 1966-07-16, 53 y.o.   MRN: [id number]   CC: Establish care  HPI:  Spanish interpreter used during the entire visit. Patient presenting to establish care with provider.  She is feeling well overall today.  She has specific complaints about tongue discomfort, chronic bilateral foot and ankle pain, desire to check blood sugar.  Tongue discomfort-patient states that she has had discomfort of her tongue for approximately 6 months that feels like she has bumps in her mouth and throat.  It is bothersome to her but it is not painful.  Denies difficulty or pain with swallowing.  Is exacerbated by chewing and talking.  Feels that the bumps pressed on the top of her mouth when she is breathing.  Patient denies any contacts with similar problem.  Denies any abnormal exposures to organic or nonorganic substances.  She does not work and she cleans her house with bleach.  She does not take any medications.  Has not had any changes in food or medications recently.  She has never experienced this symptom before.  Denies having any regurgitation, nausea, vomiting, GERD symptoms.  Patient has lost about 2 pounds in the last month which was unintentional.  Denies fevers.  Patient is not sexually active.  Patient history positive for gallbladder surgery and she has been unable to tolerate certain foods since that time but has not had diet changes associated with her new symptoms.  Patient does not smoke or drink alcohol.  Patient has chronic foot and ankle pain which is described as mild.  Patient states that rubbing the feet and ankles and resting improves her pain.  She is not taking any medications for them at this time.  Denies any weakness or injury to the ankles.  Patient would also like to get her blood sugar checked today.  She denies a history of diabetes or high blood sugars.  Denies family history of diabetes.  Denies polyuria or polydipsia.   Very mild weight loss as noted above.  Smoking status reviewed   ROS: pertinent noted in the HPI   Past Medical History:  Diagnosis Date  . Biliary colic   . Edema of foot    bilateral ankles-most times  . History of gallstones    symptomatic   Past Surgical History:  Procedure Laterality Date  . childbirth     x4 -NVD  . LAPAROSCOPIC CHOLECYSTECTOMY SINGLE SITE WITH INTRAOPERATIVE CHOLANGIOGRAM N/A 03/22/2016   Procedure: LAPAROSCOPIC CHOLECYSTECTOMY SINGLE SITE WITH INTRAOPERATIVE CHOLANGIOGRAM;  Surgeon: Michael Boston, MD;  Location: [facility];  Service: General;  Laterality: N/A;   I have personally reviewed pertinent past medical history, surgical, family, and social history as appropriate.  Objective:  BP 130/80   Pulse 73   Ht 5\' 2"  (1.575 m)   Wt 159 lb 9.6 oz (72.4 kg)   LMP 12/14/2015 Comment: 3 months ago -irregular since off birth control  SpO2 99%   BMI 29.19 kg/m   Vitals and nursing note reviewed  General: NAD, pleasant, able to participate in exam HEENT: Bilateral TM negative for erythema.  Right TM has some opacity appearing to be old scar tissue making it difficult to appreciate the middle ear structures.  Canals were clear of debris, erythema, edema. Nares patent bilaterally. Oral mucosa moist and positive for poor dentition. Tongue positive for hypertrophied taste buds particularly in posterior lateral positions that are in contact with the teeth.  Negative for exudates,  erythema.  Oropharynx appeared unobstructed. Extremities: Bilateral ankles negative for edema. Skin: warm and dry, no rashes noted Neuro: alert, no obvious focal deficits Psych: Slightly anxious demeanor.   Assessment & Plan:    Nonspecific irritant contact stomatitis Patient has irritated lateral tongue from talking and eating. Prescribed Magic mouthwash consisting of diphenhydramine, viscous lidocaine, antacid, nystatin, and corticosteroids. Recommended patient return if this  treatment has no improvement in her symptoms. Discussed with patient that if she has concern for difficulties with breathing, swallowing to go to the emergency department or call 911.  Polyarthritis Recommended patient try over-the-counter Tylenol and or NSAIDs as needed. Return if no improvement.  Hyperglycemia Patient without history of hyperglycemia would like to check blood sugars today.  Denies other symptoms of diabetes. CBG today nonfasting is 104 Recheck at next appointment fasting   Orders Placed This Encounter  Procedures  . Glucose (CBG)    Meds ordered this encounter  Medications  . magic mouthwash w/lidocaine SOLN    Sig: Take 5 mLs by mouth 3 (three) times daily as needed for mouth pain.    Dispense:  50 mL    Refill:  0    Please dispense patient a mouthwash consisting of diphenhydramine, viscous lidocaine, antacid, nystatin, and corticosteroids if possible. Thank you.  Marland Kitchen ibuprofen (ADVIL) 600 MG tablet    Sig: Take 1 tablet (600 mg total) by mouth every 8 (eight) hours as needed for moderate pain.    Dispense:  90 tablet    Refill:  0    Jamelle Rushing, DO [facility] PGY-2

## 2019-05-28 NOTE — Assessment & Plan Note (Signed)
Patient without history of hyperglycemia would like to check blood sugars today.  Denies other symptoms of diabetes. CBG today nonfasting is 104 Recheck at next appointment fasting

## 2019-05-28 NOTE — Patient Instructions (Signed)
It was a pleasure to see you today!  To summarize our discussion for this visit:  I am prescribing a mouthwash for your discomfort.  Please use this up to 3 times per day and return to see me if your tongue is getting worse or does not improve in the next couple of months.  I am prescribing ibuprofen for your chronic foot and ankle pain.  We are checking your blood sugar at this visit.  Some additional health maintenance measures we should update are: Health Maintenance Due  Topic Date Due  . HIV Screening  06/07/1983  . TETANUS/TDAP  06/07/1987  . COLONOSCOPY  06/06/2018  . INFLUENZA VACCINE  03/13/2019  .    Please return to our clinic to see me about 2 months.  Call the clinic at [phone] if your symptoms worsen or you have any concerns.   Thank you for allowing me to take part in your care,  Dr. Doristine Mango

## 2019-05-28 NOTE — Assessment & Plan Note (Signed)
Patient has irritated lateral tongue from talking and eating. Prescribed Magic mouthwash consisting of diphenhydramine, viscous lidocaine, antacid, nystatin, and corticosteroids. Recommended patient return if this treatment has no improvement in her symptoms. Discussed with patient that if she has concern for difficulties with breathing, swallowing to go to the emergency department or call 911.

## 2019-06-11 ENCOUNTER — Telehealth: Payer: Self-pay

## 2019-06-11 NOTE — Telephone Encounter (Signed)
Pharmacy calls nurse line asking for the ratio of ingredients for magic mouth wash. Please advise.

## 2019-06-15 NOTE — Telephone Encounter (Signed)
Pharmacy calling again

## 2019-06-15 NOTE — Telephone Encounter (Signed)
30 ml viscous lidocaine 2% 60 ml Maalox (do not substitute Kaopectate) 30 ml diphenhydramine 12.5 mg per 5 ml elixir 40 ml Carafate 1 gm per 10 ml

## 2019-06-16 NOTE — Telephone Encounter (Signed)
Verbal given to pharmacist

## 2019-11-19 IMAGING — MG DIGITAL SCREENING BILATERAL MAMMOGRAM WITH TOMO AND CAD
8 series · 8 of 24 positions shown · non-contrast
Comparison: Previous exam(s).

CLINICAL DATA: Screening.

EXAM:
DIGITAL SCREENING BILATERAL MAMMOGRAM WITH TOMO AND CAD

[R MLO synth-2D]
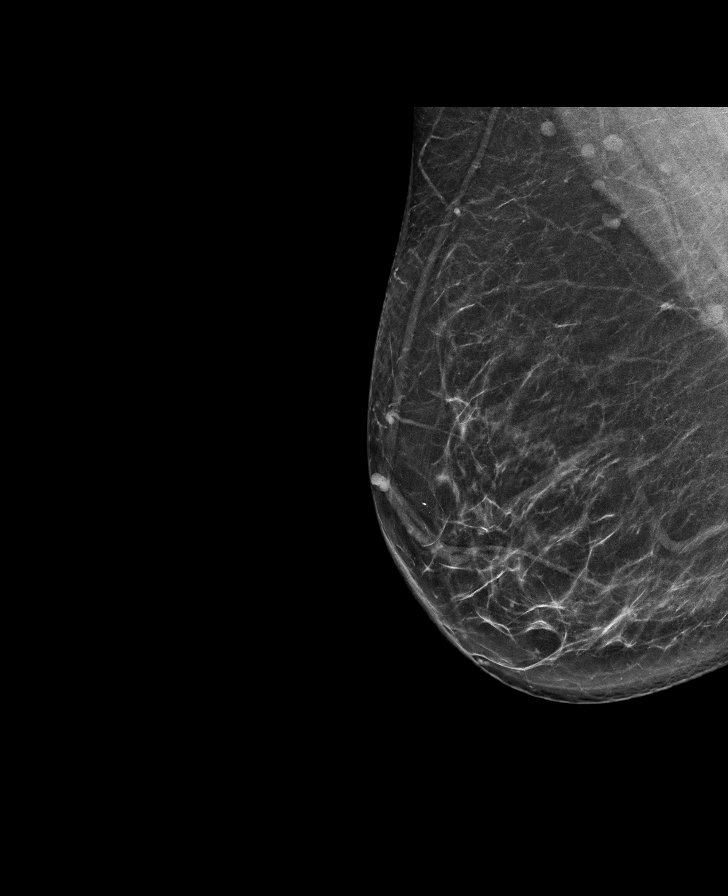

[L MLO synth-2D]
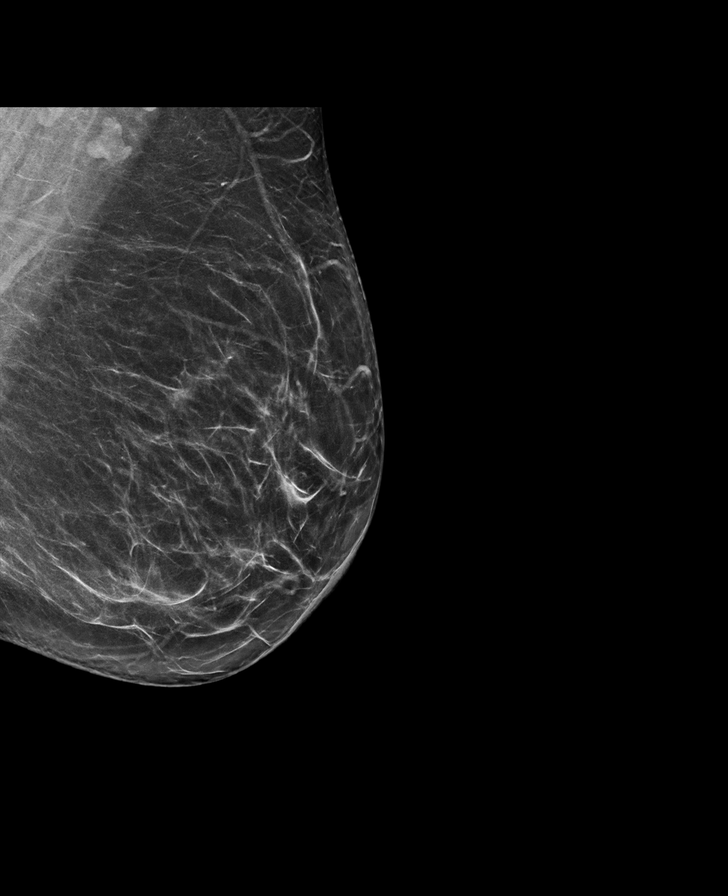

[L CC synth-2D]
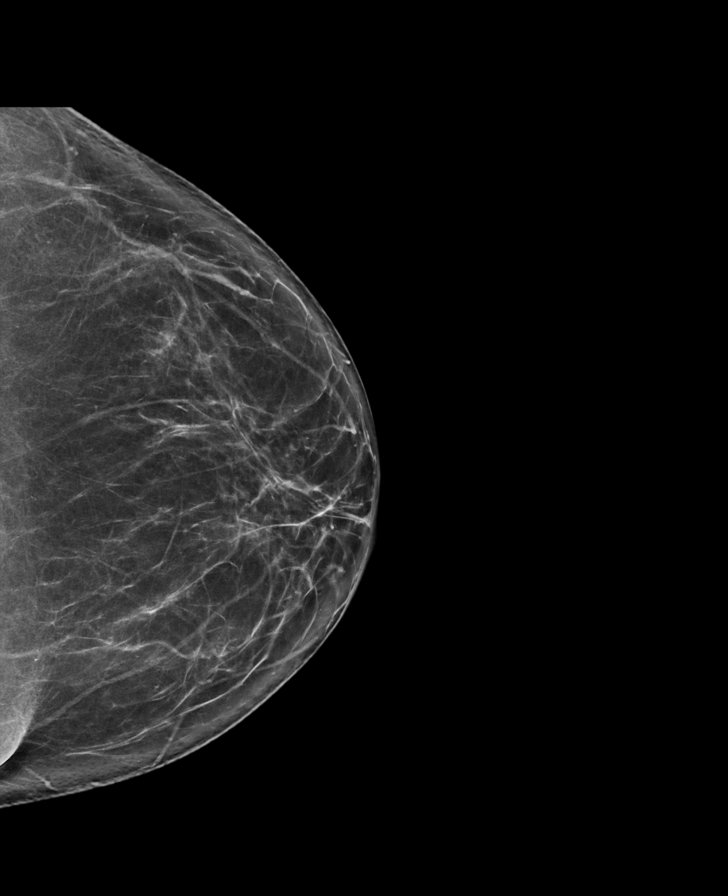

[R CC synth-2D]
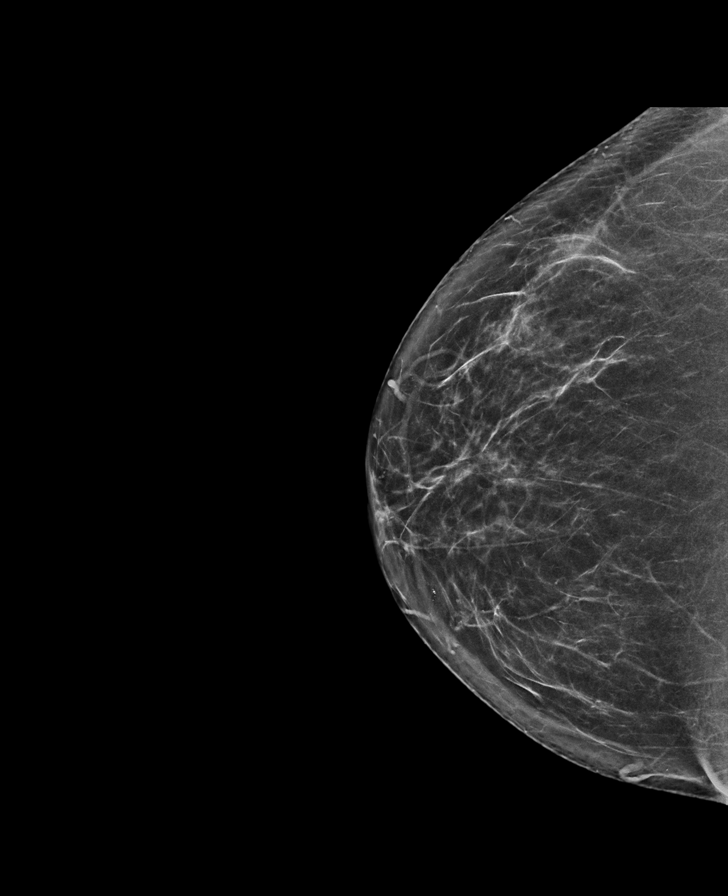

[R CC tomo · tomo slice 37/72.0]
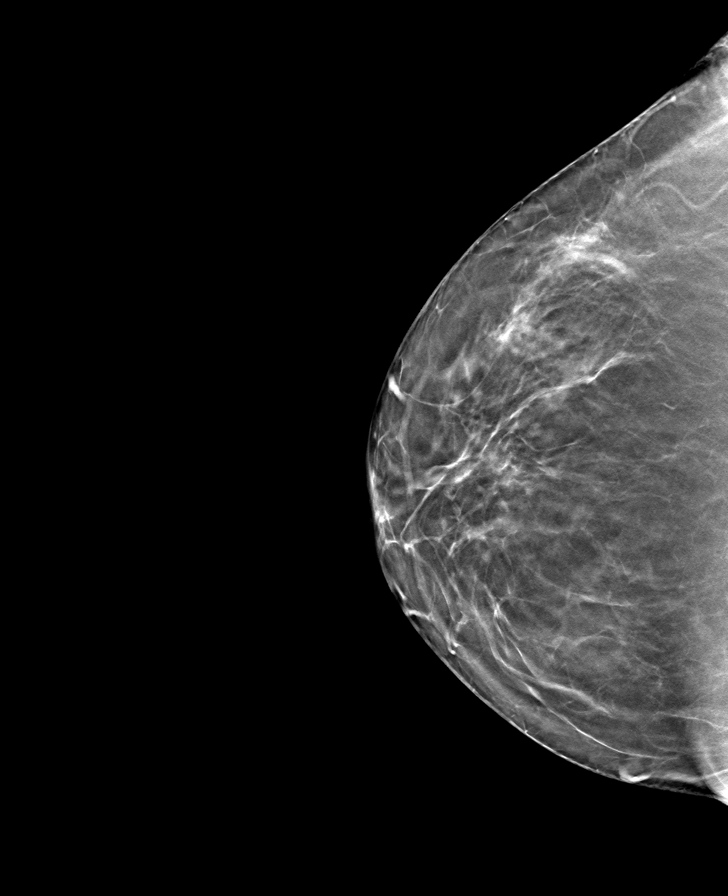

[R MLO tomo · tomo slice 39/77.0]
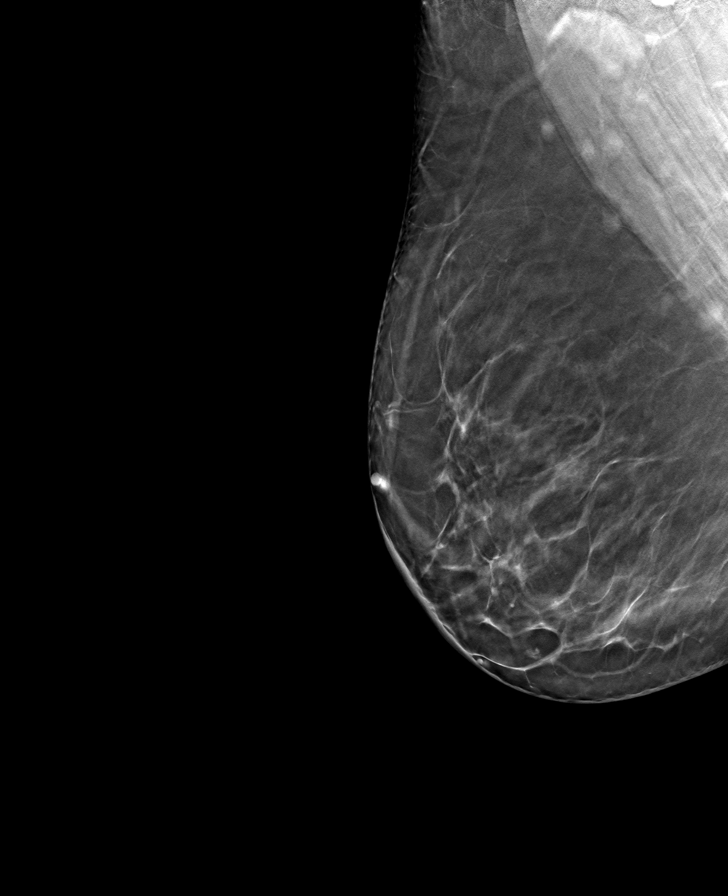

[L CC tomo · tomo slice 39/76.0]
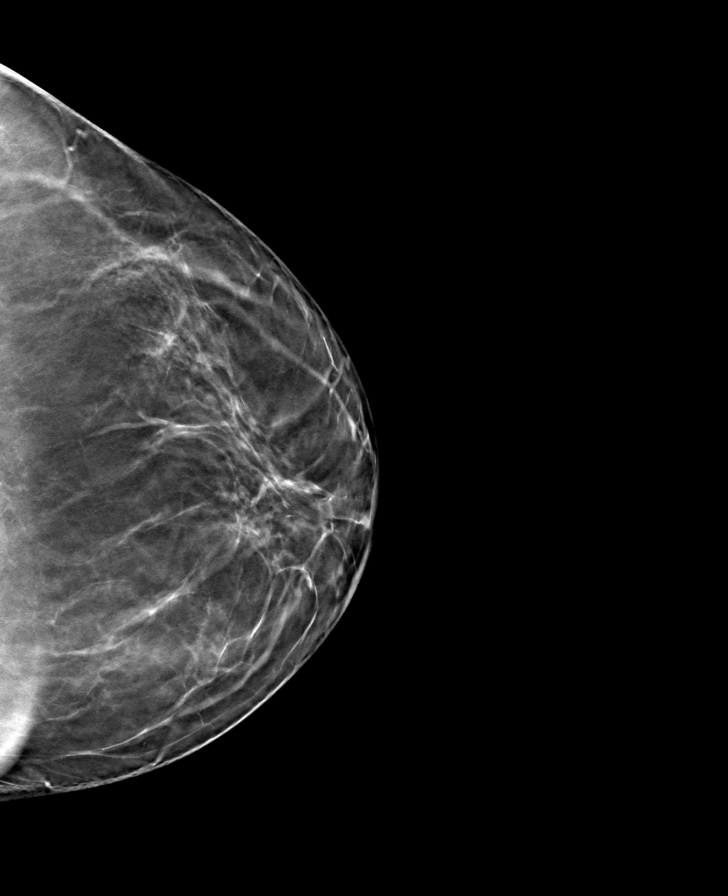

[L MLO tomo · tomo slice 39/78.0]
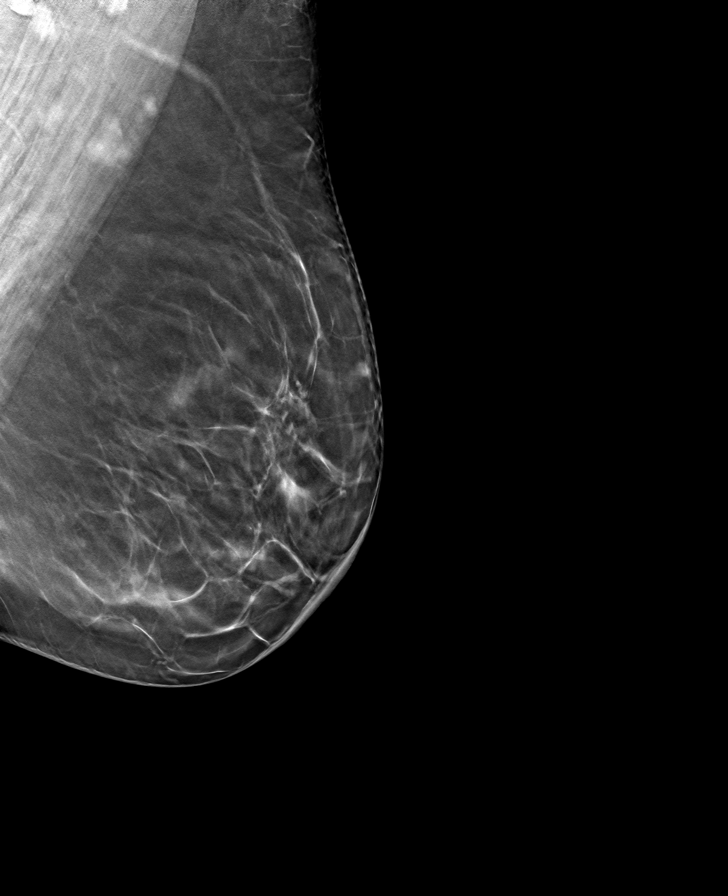

[8 of 24 positions shown; findings below may reference images not displayed]

ACR Breast Density Category b: There are scattered areas of
fibroglandular density.
FINDINGS: There are no findings suspicious for malignancy. Images were
processed with CAD.
IMPRESSION: No mammographic evidence of malignancy. A result letter of this
screening mammogram will be mailed directly to the patient.

RECOMMENDATION:
Screening mammogram in one year. (Code:[id number])

BI-RADS CATEGORY  1: Negative.

## 2020-08-24 ENCOUNTER — Other Ambulatory Visit: Payer: Self-pay

## 2020-08-24 ENCOUNTER — Encounter: Payer: Self-pay | Admitting: Family Medicine

## 2020-08-24 ENCOUNTER — Ambulatory Visit (INDEPENDENT_AMBULATORY_CARE_PROVIDER_SITE_OTHER): Payer: Self-pay | Admitting: Family Medicine

## 2020-08-24 VITALS — BP 128/78 | HR 67 | Ht 62.0 in | Wt 168.4 lb

## 2020-08-24 DIAGNOSIS — E785 Hyperlipidemia, unspecified: Secondary | ICD-10-CM

## 2020-08-24 DIAGNOSIS — Z0000 Encounter for general adult medical examination without abnormal findings: Secondary | ICD-10-CM

## 2020-08-24 DIAGNOSIS — R739 Hyperglycemia, unspecified: Secondary | ICD-10-CM

## 2020-08-24 LAB — POCT GLYCOSYLATED HEMOGLOBIN (HGB A1C): Hemoglobin A1C: 5.4 % (ref 4.0–5.6)

## 2020-08-24 NOTE — Patient Instructions (Addendum)
Ask for paperwork for the "orange card".   Mobile mammogram bus--look online to schedule.

## 2020-08-24 NOTE — Progress Notes (Signed)
    SUBJECTIVE:   Chief compliant/HPI: annual examination  Jasmine Harvey is a 55 y.o. who presents today for an annual exam.   She reports she is doing well, only concern is intermittent dry mouth with poor dentition.  States whenever she eats dry foods such as bread and large quantities her mouth will feel dry, otherwise has no issues any other time.  No associated dry/gritty eyes or difficulty swallowing.  Does not feel the need to drink more water to keep her mouth moist, however does drink about 3 bottles a day.  Has not been to a dentist in quite some time with several teeth missing.  Not on any medications.  She has no insurance.  Requests lipid panel today.  History tabs reviewed and updated: Yes.   Review of systems form reviewed and notable for denied any chest pain, dyspnea on exertion/shortness of breath, abdominal pain, unexpected weight loss, persistent fever..   OBJECTIVE:   BP 128/78   Pulse 67   Ht $R'5\' 2"'XU$  (1.575 m)   Wt 168 lb 6.4 oz (76.4 kg)   LMP 12/14/2015  SpO2 99%   BMI 30.80 kg/m   General: Alert, NAD HEENT: NCAT, MMM, oropharynx nonerythematous, poor dentition with several posterior/superior molars missing, no significant surrounding gingival erythema or swelling.  Thyroid nonenlarged without nodules palpated. Cardiac: RRR no m/g/r Lungs: Clear bilaterally, no increased WOB  Abdomen: soft, non-tender Msk: Moves all extremities spontaneously  Ext: Warm, dry, 2+ distal pulses, no edema b/l   ASSESSMENT/PLAN:   Annual Examination  See AVS for age appropriate recommendations  PHQ score 0, reviewed and discussed.  BP reviewed and at goal.   Asked about intimate partner violence and resources given as appropriate   Considered the following items based upon USPSTF recommendations: Diabetes screening: ordered Screening for elevated cholesterol: ordered (discussed out-of-pocket price for both)  STD screening: Discussed and declined, low risk, will  consider in the future once insurance/orange card if possible Reviewed risk factors for latent tuberculosis and not indicated  Discussed family history, BRCA testing not indicated.  Cervical cancer screening: prior Pap reviewed, repeat due in 2023 Breast cancer screening: Discussed and will be due for next mammogram, last in 2019 and normal.  Provided information on free mammograms in [place] area as she is uninsured. Colorectal cancer screening: discussed and declined today due to expense, will trial Cologuard or iFOBT if they qualify for orange card/financial aid. Lung cancer screening: Not indicated, lifetime non-smoker.  Vaccinations due for Tdap and second dose of COVID-vaccine, discussed going to [facility] for Tdap.  She is not interested in the second dose of her vaccine, will continue to consider in the future.  Dry mouth  Poor dentition:  Intermittent, only appears to be with significantly dry foods.  MMM appear moist.  Suspect her poor dentition is likely contributing, provided with free dental resources in the community.  No additional concerning s/sx for Sjogren's at this time.  Recommended maintaining hydration and can try sugar-free lozenges as needed.  Follow up in 6 months or sooner if indicated.    Patriciaann Clan, [place]

## 2020-08-25 ENCOUNTER — Encounter: Payer: Self-pay | Admitting: Family Medicine

## 2020-08-25 LAB — LIPID PANEL
Chol/HDL Ratio: 5.4 ratio — ABNORMAL HIGH (ref 0.0–4.4)
Cholesterol, Total: 217 mg/dL — ABNORMAL HIGH (ref 100–199)
HDL: 40 mg/dL (ref >39)
LDL Chol Calc (NIH): 149 mg/dL — ABNORMAL HIGH (ref 0–99)
Triglycerides: 156 mg/dL — ABNORMAL HIGH (ref 0–149)
VLDL Cholesterol Cal: 28 mg/dL (ref 5–40)

## 2021-03-01 NOTE — Progress Notes (Signed)
    SUBJECTIVE:   CHIEF COMPLAINT / HPI:   Throat Pain Patient notes that she continues to have intermittent throat pain.  States that it is in the "front" of her throat.  Also feels that her teeth feel "tight" when she wakes up.  She saw dentist last month and had some teeth pullsed. States she needs clearance for dental cleaning due to elevated blood pressure 130s/90s at dentist office. She brushes her teeth twice daily but does not floss or use mouthwash daily. No change in appetite, still able to eat well. Has not tried lozenges as previously recommended.   PERTINENT  PMH / PSH:  Past Medical History:  Diagnosis Date   Biliary colic    Edema of foot    bilateral ankles-most times   History of gallstones    symptomatic     OBJECTIVE:   BP 133/76   Pulse 68   Ht 5\' 2"  (1.575 m)   Wt 177 lb 6.4 oz (80.5 kg)   LMP 12/14/2015 Comment: 3 months ago -irregular since off birth control  SpO2 100%   BMI 32.45 kg/m    General: Awake, alert, oriented, in no acute distress, pleasant and cooperative with examination HEENT: Normocephalic, atraumatic, nares patent, dentition is poor with missing teeth and caries, oropharynx without erythema or exudates, TM's clear bilaterally, no thyroid nodules palpated Cardio: RRR without murmur, 2+ radial, DP and PT pulses b/l Respiratory: CTAB without wheezing/rhonchi/rales Abdomen: Soft, mild tenderness across upper abdomen but greatest in LUQ, non-distended, no rebound/guarding, no organomegaly MSK: Able to move all extremities spontaneously, good muscle strength, no abnormalities Extremities: without edema or cyanosis Neuro: Speech is clear and intact, no focal deficits, no facial asymmetry, follows commands  Psych: Normal mood and affect   ASSESSMENT/PLAN:   Pain in throat Intermittent.  Question if related to her dental hygiene or GERD given that she had some diffuse abdominal pain in her upper abdomen.  Also appears that the pain comes and  goes with certain foods. No dysphagia or weight loss. Will trial Famotidine to see if this helps. Discussed other ways to help with reflux such as avoiding eating late at night, avoiding alcohol and limiting caffeine intake.   Healthcare maintenance She is overdue for a mammogram and has never had colon cancer screening.  Patient is uninsured, posing some barriers.  Provided patient with mammogram scholarship found.  Recommended that she pick up orange card packet at the front to help with other financial barriers.  She would like to discuss iFOBT more after she receives the [facility] card.     [facility], DO [facility]

## 2021-03-02 ENCOUNTER — Other Ambulatory Visit: Payer: Self-pay

## 2021-03-02 ENCOUNTER — Ambulatory Visit (INDEPENDENT_AMBULATORY_CARE_PROVIDER_SITE_OTHER): Payer: Self-pay | Admitting: Family Medicine

## 2021-03-02 ENCOUNTER — Ambulatory Visit: Payer: No Typology Code available for payment source | Admitting: Family Medicine

## 2021-03-02 DIAGNOSIS — R070 Pain in throat: Secondary | ICD-10-CM | POA: Insufficient documentation

## 2021-03-02 DIAGNOSIS — Z0000 Encounter for general adult medical examination without abnormal findings: Secondary | ICD-10-CM | POA: Insufficient documentation

## 2021-03-02 MED ORDER — FAMOTIDINE 10 MG PO TABS: 10.0000 mg | ORAL_TABLET | Freq: Two times a day (BID) | ORAL | 1 refills | Status: DC

## 2021-03-02 NOTE — Patient Instructions (Addendum)
It was wonderful to meet you today.  Please bring ALL of your medications with you to every visit.   Today we talked about:  -Fill out the paperwork for the Mammogram. -Apply for the [facility] Card to help with financial assistance. -Brush your teeth, floss and use mouthwash daily for your dental health. -I am prescribing you a medication for reflux that you should take twice a day. Other things that help are avoiding eating late at night, avoiding alcohol and limiting coffee and acidic foods. -We can discuss colon cancer screening after you have the [employer].   Thank you for choosing [facility].   Please call [phone] with any questions about today's appointment.  Please be sure to schedule follow up at the front  desk before you leave today.   Sabino Dick, DO PGY-2 Family Medicine

## 2021-03-02 NOTE — Assessment & Plan Note (Signed)
She is overdue for a mammogram and has never had colon cancer screening.  Patient is uninsured, posing some barriers.  Provided patient with mammogram scholarship found.  Recommended that she pick up orange card packet at the front to help with other financial barriers.  She would like to discuss iFOBT more after she receives the [facility] card.

## 2021-03-02 NOTE — Assessment & Plan Note (Signed)
Intermittent.  Question if related to her dental hygiene or GERD given that she had some diffuse abdominal pain in her upper abdomen.  Also appears that the pain comes and goes with certain foods. No dysphagia or weight loss. Will trial Famotidine to see if this helps. Discussed other ways to help with reflux such as avoiding eating late at night, avoiding alcohol and limiting caffeine intake.

## 2021-11-14 ENCOUNTER — Encounter: Payer: Self-pay | Admitting: Student

## 2021-11-14 ENCOUNTER — Ambulatory Visit (INDEPENDENT_AMBULATORY_CARE_PROVIDER_SITE_OTHER): Payer: Self-pay | Admitting: Student

## 2021-11-14 VITALS — BP 126/82 | HR 69 | Ht 62.0 in | Wt 178.1 lb

## 2021-11-14 DIAGNOSIS — E782 Mixed hyperlipidemia: Secondary | ICD-10-CM

## 2021-11-14 DIAGNOSIS — Z0000 Encounter for general adult medical examination without abnormal findings: Secondary | ICD-10-CM

## 2021-11-14 DIAGNOSIS — M6788 Other specified disorders of synovium and tendon, other site: Secondary | ICD-10-CM | POA: Insufficient documentation

## 2021-11-14 DIAGNOSIS — Z131 Encounter for screening for diabetes mellitus: Secondary | ICD-10-CM

## 2021-11-14 LAB — POCT GLYCOSYLATED HEMOGLOBIN (HGB A1C): Hemoglobin A1C: 5.5 % (ref 4.0–5.6)

## 2021-11-14 MED ORDER — NAPROXEN 500 MG PO TABS: 500.0000 mg | ORAL_TABLET | Freq: Two times a day (BID) | ORAL | 0 refills | Status: DC

## 2021-11-14 NOTE — Patient Instructions (Addendum)
Ms. Duggan, ? ?Es Psychiatrist cuidar de usted. Lamento tu dolor de tobillo. Creo que tienes lo que se llama tendinopat?a peronea. La mejor manera de tratar esto es con algunos medicamentos antiinflamatorios y con ejercicio. Estoy enviando algunos medicamentos a su farmacia que puede tomar hasta 2 veces al d?a. Tambi?n te env?o algunos ejercicios que me gustar?a que hicieras 2 veces al d?a. ?Si sus s?ntomas no mejoran, me gustar?a verlo de regreso en 4 a 6 semanas. ??Tu nivel de az?car en la sangre y tu presi?n arterial se ven excelentes hoy! Felicidades. ?Me gustar?a que complete la solicitud de asistencia financiera para que podamos hacerle pruebas de detecci?n de c?ncer de mama y c?ncer de colon. ? ?Estamos revisando algunos laboratorios hoy. Te llamar? si son anormales. Le enviar? un mensaje de MyChart o una carta si son normales. Si no se entera de sus laboratorios en las pr?ximas 2 semanas, h?ganoslo saber. ? ?Cu?dese y busque atenci?n inmediata antes si tiene alguna inquietud ?It is a pleasure to take care of you.  I am sorry about your ankle pain.  I think you have what is called peroneal tendinopathy.  The best way to treat this is with some anti-inflammatory medications and with exercise.  I am sending some medicines to your pharmacy that you can take up to 2 times per day.  I am also sending you some exercises that I would like you to do 2 times per day. ?If your symptoms do not get any better, I would like to see you back in 4 to 6 weeks. ?Your blood sugar and blood pressure look excellent today!  Congratulations. ?I would like you to fill out the application for financial assistance so that we can get you screened for breast cancer and colon cancer. ? ?We are checking some labs today. I will call you if they are abnormal. I will send you a MyChart message or a letter if they are normal.  If you do not hear about your labs in the next 2 weeks please let us know. ? ?Take care and seek immediate care  sooner if you develop any concerns.  ? ?Eliezer Mccoy, MD ?[facility] ? ?

## 2021-11-14 NOTE — Assessment & Plan Note (Signed)
Lipid panel today

## 2021-11-14 NOTE — Assessment & Plan Note (Signed)
Suspect that swelling and pain represent peroneal tendinopathy.  No concern for bony involvement or infectious process. ?-Gave patient exercises for home ?-Naproxen twice daily as needed ?

## 2021-11-14 NOTE — Assessment & Plan Note (Addendum)
-  Provided patient with [facility] patient assistance application.  If approved for financial assistance, would benefit from colonoscopy and mammogram ?-A1c within normal limits at 5.5 today.  Congratulated patient on good lifestyle choices ?

## 2021-11-14 NOTE — Progress Notes (Signed)
? ? ?  SUBJECTIVE:  ? ?CHIEF COMPLAINT / HPI:  ? ?R ankle pain ?Patient has noticed some swelling and pain on her right ankle directly behind and below the lateral malleolus.  She denies any history of injury to the area and says that the pain while relatively mild is annoying to her.  It is worse after she has been standing or walking for an extended period of time and does improve with rest.  She has not tried any medical or nonmedical therapies for it. ? ?Health Maintenance ?She was seen by Dr. Melba Coon last year who worked with her to get patient assistance forms filled out so that she could pursue colon and breast cancer screening.  She is still interested in getting this screening done but has not been able to fill out the application.  She is unsure if she still has a copy.  She remains uninsured.   ? ? ?OBJECTIVE:  ? ?BP 126/82   Pulse 69   Ht 5\' 2"  (1.575 m)   Wt 178 lb 2 oz (80.8 kg)   LMP 12/14/2015 Comment: 3 months ago -irregular since off birth control  SpO2 100%   BMI 32.58 kg/m?   ?Physical Exam ?Vitals reviewed.  ?Constitutional:   ?   General: She is not in acute distress. ?Cardiovascular:  ?   Rate and Rhythm: Normal rate and regular rhythm.  ?   Pulses: Normal pulses.  ?Musculoskeletal:  ?     Feet: ? ?Feet:  ?   Comments: Mild edema without bruising or tenderness to the area inferior posterior to the lateral malleolus of the right ankle.  Full range of motion bilaterally.  Strength intact in all planes.  Gait normal. ?Neurological:  ?   Mental Status: She is alert.  ? ? ? ?ASSESSMENT/PLAN:  ? ?Right peroneal tendinosis ?Suspect that swelling and pain represent peroneal tendinopathy.  No concern for bony involvement or infectious process. ?-Gave patient exercises for home ?-Naproxen twice daily as needed ? ?Healthcare maintenance ?-Provided patient with [facility] patient assistance application.  If approved for financial assistance, would benefit from colonoscopy and mammogram ?-A1c within normal  limits at 5.5 today.  Congratulated patient on good lifestyle choices ? ?Moderate mixed hyperlipidemia not requiring statin therapy ?-Lipid panel today ?  ? ? ?02/13/2016, MD ?[facility]  ?

## 2021-11-15 LAB — LIPID PANEL
Chol/HDL Ratio: 3.6 ratio (ref 0.0–4.4)
Cholesterol, Total: 222 mg/dL — ABNORMAL HIGH (ref 100–199)
HDL: 61 mg/dL (ref >39)
LDL Chol Calc (NIH): 125 mg/dL — ABNORMAL HIGH (ref 0–99)
Triglycerides: 206 mg/dL — ABNORMAL HIGH (ref 0–149)
VLDL Cholesterol Cal: 36 mg/dL (ref 5–40)

## 2023-02-27 NOTE — Progress Notes (Unsigned)
    SUBJECTIVE:   Chief compliant/HPI: annual examination  Jasmine Harvey is a 57 y.o. who presents today for an annual exam.   Uninsured - [place] card packet and [facility] financial assistance Due for colonoscopy, mammogram, pap Labs: HIV, Hep C, A1c  History tabs reviewed and updated ***.   Review of systems form reviewed and notable for ***.   OBJECTIVE:   LMP 12/14/2015 Comment: 3 months ago -irregular since off birth control  ***  ASSESSMENT/PLAN:   No problem-specific Assessment & Plan notes found for this encounter.    Annual Examination  See AVS for age appropriate recommendations  PHQ score ***, reviewed and discussed.  BP reviewed and at goal ***.  Asked about intimate partner violence and resources given as appropriate  Advance directives discussion ***  Considered the following items based upon USPSTF recommendations: Diabetes screening: {discussed/ordered:14545} Screening for elevated cholesterol: {discussed/ordered:14545} HIV testing: {discussed/ordered:14545} Hepatitis C: {discussed/ordered:14545} Hepatitis B: {discussed/ordered:14545} Syphilis if at high risk: {discussed/ordered:14545} GC/CT {GC/CT screening :23818} Osteoporosis screening considered based upon risk of fracture from [facility] calculator. Major osteoporotic fracture risk is ***%. DEXA {ordered not order:23822}.  Reviewed risk factors for latent tuberculosis and {not indicated/requested/declined:14582}   Discussed family history, BRCA testing {not indicated/requested/declined:14582}. Tool used to risk stratify was ***.  Cervical cancer screening: {PAPTYPE:23819} Breast cancer screening: {mammoscreen:23820} Colorectal cancer screening: {crcscreen:23821::"discussed, colonoscopy ordered"} Lung cancer screening: {discussed/declined/written info:19698}. See documentation below regarding indications/risks/benefits.  Vaccinations ***.   Follow up in 1 *** year or sooner if indicated.    Elberta Fortis, MD [facility]

## 2023-02-28 ENCOUNTER — Telehealth: Payer: Self-pay

## 2023-02-28 ENCOUNTER — Ambulatory Visit (INDEPENDENT_AMBULATORY_CARE_PROVIDER_SITE_OTHER): Payer: Self-pay | Admitting: Family Medicine

## 2023-02-28 ENCOUNTER — Encounter: Payer: Self-pay | Admitting: Family Medicine

## 2023-02-28 VITALS — BP 136/80 | HR 71 | Wt 188.0 lb

## 2023-02-28 DIAGNOSIS — Z114 Encounter for screening for human immunodeficiency virus [HIV]: Secondary | ICD-10-CM

## 2023-02-28 DIAGNOSIS — Z1211 Encounter for screening for malignant neoplasm of colon: Secondary | ICD-10-CM

## 2023-02-28 DIAGNOSIS — Z1159 Encounter for screening for other viral diseases: Secondary | ICD-10-CM

## 2023-02-28 DIAGNOSIS — E669 Obesity, unspecified: Secondary | ICD-10-CM

## 2023-02-28 DIAGNOSIS — G8929 Other chronic pain: Secondary | ICD-10-CM

## 2023-02-28 DIAGNOSIS — M25571 Pain in right ankle and joints of right foot: Secondary | ICD-10-CM

## 2023-02-28 DIAGNOSIS — N950 Postmenopausal bleeding: Secondary | ICD-10-CM

## 2023-02-28 DIAGNOSIS — M25572 Pain in left ankle and joints of left foot: Secondary | ICD-10-CM

## 2023-02-28 DIAGNOSIS — Z0000 Encounter for general adult medical examination without abnormal findings: Secondary | ICD-10-CM

## 2023-02-28 DIAGNOSIS — Z9049 Acquired absence of other specified parts of digestive tract: Secondary | ICD-10-CM

## 2023-02-28 LAB — POCT GLYCOSYLATED HEMOGLOBIN (HGB A1C): Hemoglobin A1C: 5.6 % (ref 4.0–5.6)

## 2023-02-28 MED ORDER — MELOXICAM 15 MG PO TABS: 15.0000 mg | ORAL_TABLET | Freq: Every day | ORAL | 0 refills | Status: AC

## 2023-02-28 NOTE — Patient Instructions (Addendum)
It was wonderful to see you today! Thank you for choosing [facility].   Please bring ALL of your medications with you to every visit.   Today we talked about:  For your ankle and foot pain, please do the attached exercises every day. I would recommend and insert called SuperFeet for your shoes. Please ice your feet at the end of every day. I am sending a short course of Meloxicam that you take once per day for the next 10 days for inflammation. I sent in the order for the colon cancer screening. We are also checking lab work today that I will follow up with you about. Please fill out the financial assistance paperwork to get your mammogram done.  You are due for a Pap smear as well. Please come back and have it done here when you get the endometrial biopsy.  Please follow up in 1 month for ankle pain  If you haven't already, sign up for My Chart to have easy access to your labs results, and communication with your primary care physician.   We are checking some labs today. If they are abnormal, I will call you. If they are normal, I will send you a MyChart message (if it is active) or a letter in the mail. If you do not hear about your labs in the next 2 weeks, please call the office.  Call the clinic at [phone] if your symptoms worsen or you have any concerns.  Please be sure to schedule follow up at the front desk before you leave today.   Elberta Fortis, DO Family Medicine

## 2023-02-28 NOTE — Telephone Encounter (Signed)
Error

## 2023-03-01 LAB — COMPREHENSIVE METABOLIC PANEL
ALT: 28 IU/L (ref 0–32)
AST: 18 IU/L (ref 0–40)
Albumin: 4.4 g/dL (ref 3.8–4.9)
Alkaline Phosphatase: 73 IU/L (ref 44–121)
BUN/Creatinine Ratio: 18 (ref 9–23)
BUN: 14 mg/dL (ref 6–24)
Bilirubin Total: 0.2 mg/dL (ref 0.0–1.2)
CO2: 22 mmol/L (ref 20–29)
Calcium: 9.1 mg/dL (ref 8.7–10.2)
Chloride: 101 mmol/L (ref 96–106)
Creatinine, Ser: 0.78 mg/dL (ref 0.57–1.00)
Globulin, Total: 2.8 g/dL (ref 1.5–4.5)
Glucose: 93 mg/dL (ref 70–99)
Potassium: 4.3 mmol/L (ref 3.5–5.2)
Sodium: 141 mmol/L (ref 134–144)
Total Protein: 7.2 g/dL (ref 6.0–8.5)
eGFR: 90 mL/min/{1.73_m2} (ref >59)

## 2023-03-01 LAB — CBC WITH DIFFERENTIAL/PLATELET
Basophils Absolute: 0 10*3/uL (ref 0.0–0.2)
Basos: 1 %
EOS (ABSOLUTE): 0.1 10*3/uL (ref 0.0–0.4)
Eos: 2 %
Hematocrit: 42.4 % (ref 34.0–46.6)
Hemoglobin: 14.1 g/dL (ref 11.1–15.9)
Immature Grans (Abs): 0 10*3/uL (ref 0.0–0.1)
Immature Granulocytes: 0 %
Lymphocytes Absolute: 2.7 10*3/uL (ref 0.7–3.1)
Lymphs: 40 %
MCH: 29.7 pg (ref 26.6–33.0)
MCHC: 33.3 g/dL (ref 31.5–35.7)
MCV: 89 fL (ref 79–97)
Monocytes Absolute: 0.4 10*3/uL (ref 0.1–0.9)
Monocytes: 6 %
Neutrophils Absolute: 3.5 10*3/uL (ref 1.4–7.0)
Neutrophils: 51 %
Platelets: 277 10*3/uL (ref 150–450)
RBC: 4.75 x10E6/uL (ref 3.77–5.28)
RDW: 12.7 % (ref 11.7–15.4)
WBC: 6.8 10*3/uL (ref 3.4–10.8)

## 2023-03-01 LAB — HIV ANTIBODY (ROUTINE TESTING W REFLEX): HIV Screen 4th Generation wRfx: NONREACTIVE

## 2023-03-01 LAB — HCV AB W REFLEX TO QUANT PCR: HCV Ab: NONREACTIVE

## 2023-03-01 LAB — HCV INTERPRETATION

## 2023-03-03 ENCOUNTER — Encounter: Payer: Self-pay | Admitting: Family Medicine

## 2023-03-03 DIAGNOSIS — M25571 Pain in right ankle and joints of right foot: Secondary | ICD-10-CM | POA: Insufficient documentation

## 2023-03-03 DIAGNOSIS — N950 Postmenopausal bleeding: Secondary | ICD-10-CM | POA: Insufficient documentation

## 2023-03-03 NOTE — Assessment & Plan Note (Signed)
Postmenopausal without period for 7 years but reports bleeding episode x 5 days last month. Concern for endometrial hyperplasia vs true menstrual period. Uninsured, patient opted for endometrial biopsy instead of TVUS. -Scheduled in Colposcopy clinic. Patient deferred to have Pap at that time.

## 2023-03-03 NOTE — Assessment & Plan Note (Signed)
Likely secondary to obesity vs poor shoes vs arthritis. Some mild edema noted upon exam. Will trial conservative therapy. -Recommended OTC inserts, icing and at-home stretches  -Meloxicam x 10 days -F/u in 1 months to assess pain and edema

## 2023-03-06 ENCOUNTER — Other Ambulatory Visit: Payer: Self-pay | Admitting: Family Medicine

## 2023-03-06 ENCOUNTER — Other Ambulatory Visit (HOSPITAL_COMMUNITY)
Admission: RE | Admit: 2023-03-06 | Discharge: 2023-03-06 | Disposition: A | Discharge status: Home / Self Care | Payer: Self-pay | Source: Ambulatory Visit | Attending: Family Medicine | Admitting: Family Medicine

## 2023-03-06 ENCOUNTER — Ambulatory Visit (INDEPENDENT_AMBULATORY_CARE_PROVIDER_SITE_OTHER): Payer: Self-pay | Admitting: Family Medicine

## 2023-03-06 VITALS — BP 128/76 | HR 77 | Ht 62.0 in | Wt 189.0 lb

## 2023-03-06 DIAGNOSIS — Z124 Encounter for screening for malignant neoplasm of cervix: Secondary | ICD-10-CM

## 2023-03-06 DIAGNOSIS — N950 Postmenopausal bleeding: Secondary | ICD-10-CM

## 2023-03-06 DIAGNOSIS — Z1211 Encounter for screening for malignant neoplasm of colon: Secondary | ICD-10-CM

## 2023-03-06 DIAGNOSIS — R52 Pain, unspecified: Secondary | ICD-10-CM

## 2023-03-06 DIAGNOSIS — Z0000 Encounter for general adult medical examination without abnormal findings: Secondary | ICD-10-CM

## 2023-03-06 MED ORDER — ACETAMINOPHEN 500 MG PO TABS
500.0000 mg | ORAL_TABLET | Freq: Once | ORAL | Status: AC
  Administered 2023-03-06: 500 mg via ORAL

## 2023-03-06 NOTE — Assessment & Plan Note (Signed)
Pt p/f endometrial bx today for postmenopausal bleeding. See procedure note below.

## 2023-03-06 NOTE — Assessment & Plan Note (Signed)
Pap smear collected today 

## 2023-03-06 NOTE — Patient Instructions (Signed)
Biopsia de endometrio Endometrial Biopsy  La biopsia de endometrio es un procedimiento en el que se extrae Lauris Poag de tejido del revestimiento del tero. Este revestimiento se denomina endometrio. Luego, la [place] de tejido se enva a un laboratorio para su anlisis. Es posible que le realicen este tipo de biopsia para revisar lo siguiente: Database administrator. Infeccin. Crecimientos llamados plipos. Sangrado uterino que no tiene explicacin. Informe al mdico acerca de lo siguiente: Cualquier alergia que tenga. Todos los [employer] [place], incluidas vitaminas, hierbas, gotas oftlmicas, cremas y [address] de [address]. Cualquier problema que usted o los [employer] de su familia hayan tenido con el uso de anestesia. Cualquier problema de sangrado que tenga. Cirugas a las que se haya sometido. Cualquier problema mdico que tenga. Si est embarazada o podra estarlo. Cules son los riesgos? El mdico hablar con usted [employer]. Pueden incluir: Sangrado. Infeccin. Reacciones alrgicas a los medicamentos. Dao en la pared del tero. Es poco frecuente. Qu ocurre antes del procedimiento? Lleve un registro de su perodo menstrual. Es posible que deba realizarse esta biopsia cuando no tenga el perodo menstrual. Consulte a su mdico si debe o no hacer lo siguiente: Multimedia programmer o suspender los medicamentos que [place] habitualmente. Estos incluyen cualquier medicamento para la diabetes o anticoagulantes que use. Tomar medicamentos como aspirina e ibuprofeno. Estos medicamentos pueden tener un efecto anticoagulante en la [place]. No los tome, a menos que se lo indique el mdico. Usar medicamentos de venta libre, vitaminas, hierbas y suplementos. Lleve una toallita sanitaria. Es posible que deba usar una despus de la biopsia. Haga que un adulto responsable la lleve a su casa desde el hospital o la clnica. No se le permitir conducir. Qu ocurre durante el procedimiento? Le colocarn un  instrumento en la vagina para Restaurant manager, fast food. Esto ayuda al mdico a ver el cuello uterino. El cuello uterino es la parte ms baja del tero. Le limpiarn el cuello uterino con una solucin que [employer]. Le administrarn anestesia. Esta impide que usted Stage manager. Adormecer el cuello uterino. Usarn un instrumento llamado frceps para mantener el cuello uterino estable. Introducirn una herramienta delgada llamada sonda uterina a travs del cuello uterino. Esta se usar para lo siguiente: Programme researcher, broadcasting/film/video del tero. Buscar Immunologist de donde tomar la [place]. Le introducirn un tubo blando llamado catter dentro del tero. El catter extraer Lauris Poag de tejido. Se retirarn el tubo y los instrumentos. La muestra se enviar a un laboratorio para su anlisis. El procedimiento puede variar segn el mdico y el hospital. Ladell Heads ocurre despus del procedimiento? Le controlarn la presin arterial, la frecuencia cardaca, la frecuencia respiratoria y Air cabin crew de oxgeno en la sangre hasta que le den el alta del hospital o la clnica. Es su responsabilidad retirar [employer] del procedimiento. Pregunte al mdico, o a un integrante del personal del departamento donde se realice el procedimiento, cundo estarn [employer]. Esta informacin no tiene Theme park manager el consejo del mdico. Asegrese de hacerle al mdico cualquier pregunta que tenga. Document Revised: 12/03/2022 Document Reviewed: 12/03/2022 Elsevier Patient Education  2024 [employer].

## 2023-03-06 NOTE — Progress Notes (Signed)
    SUBJECTIVE:   CHIEF COMPLAINT / HPI:   FGP is a 57yo F w/  hx of postmenopausal bleeding that p/f endometrial bx. She is also due for pap smear.   OBJECTIVE:   BP 128/76   Pulse 77   Ht 5\' 2"  (1.575 m)   Wt 189 lb (85.7 kg)   LMP 12/14/2015 Comment: 3 months ago -irregular since off birth control  SpO2 98%   BMI 34.57 kg/m   General: Alert, pleasant woman. NAD. HEENT: NCAT. MMM. Resp: Normal WOB on RA.  GU: No vaginal or cervical discharge Ext: Moves all ext spontaneously. Regular gait. Skin: Warm, well perfused   ASSESSMENT/PLAN:   Postmenopausal bleeding Pt p/f endometrial bx today for postmenopausal bleeding. See procedure note below.  Healthcare maintenance Pap smear collected today   Lincoln Brigham, MD [facility]   Endometrial Biopsy Procedure Note  Pre-operative Diagnosis: postmenopausal bleeding  Post-operative Diagnosis: same  Indications: postmenopausal bleeding  Procedure Details   Urine pregnancy test was not done.  The risks (including infection, bleeding, pain, and uterine perforation) and benefits of the procedure were explained to the patient and Written informed consent was obtained.  Antibiotic prophylaxis against endocarditis was not indicated.  The patient was placed in the dorsal lithotomy position. A Graves' speculum inserted in the vagina, and the cervix prepped with povidone iodine.     A sharp tenaculum was applied to the anterior lip of the cervix for stabilization. A Pipelle endometrial aspirator was used to sample the endometrium.  Sample was sent for pathologic examination.  Condition: Stable  Complications: None  Plan:  The patient was advised to call for any fever or for prolonged or severe pain or bleeding. She was advised to use  tylenol  as needed for mild to moderate pain. She was advised to avoid vaginal intercourse for 48 hours or until the bleeding has completely stopped.

## 2023-03-07 ENCOUNTER — Encounter: Payer: Self-pay | Admitting: Family Medicine

## 2023-03-10 NOTE — Progress Notes (Signed)
I will contact with PAP result once I have her endometrial biopsy test result.

## 2023-03-11 ENCOUNTER — Telehealth: Payer: Self-pay | Admitting: Family Medicine

## 2023-03-11 NOTE — Telephone Encounter (Addendum)
[employer] interpreter ID# [phone]  HIPAA compliant callback message left on her Mobile number. Also tried Home numbers listed with no luck.  Please advise her that her PAP and endometrial biopsy results are normal when she calls. Consider pelvic US vs. Gyn referral if vaginal bleeding persists.  I will also forward message to PCP to follow.

## 2023-03-11 NOTE — Telephone Encounter (Signed)
Patient returns call to nurse line.   Patient advised of normal results.   Patient advised to let us know if bleeding persists.

## 2024-01-29 ENCOUNTER — Ambulatory Visit (INDEPENDENT_AMBULATORY_CARE_PROVIDER_SITE_OTHER): Payer: Self-pay | Admitting: Student

## 2024-01-29 VITALS — BP 135/84 | HR 78 | Ht 62.0 in | Wt 190.2 lb

## 2024-01-29 DIAGNOSIS — E782 Mixed hyperlipidemia: Secondary | ICD-10-CM

## 2024-01-29 DIAGNOSIS — R42 Dizziness and giddiness: Secondary | ICD-10-CM

## 2024-01-29 NOTE — Progress Notes (Addendum)
    SUBJECTIVE:   CHIEF COMPLAINT / HPI:   Discussed the use of AI scribe software for clinical note transcription with the patient, who gave verbal consent to proceed.  Declines interpreter-allows daughter to help interpret History of Present Illness Jasmine Harvey is a 58 year old female who presents with intermittent lightheadedness.  She has experienced dizziness for eight days, described as lightheadedness without vertigo. She states the lightheaded feeling is localized to the back of her head, occurring multiple times a day and lasting a few seconds each time. It is not consistently triggered by positional changes and can occur while walking or at rest. There are no associated symptoms such as tinnitus, headaches, or weakness. No history of trauma or loss of consciousness. She has not experienced fevers or cold symptoms. Her current medications include a multivitamin and fish oil.  PERTINENT  PMH / PSH: Polyarthritis, hyperlipidemia  OBJECTIVE:   BP 135/84   Pulse 78   Ht 5' 2 (1.575 m)   Wt 190 lb 4 oz (86.3 kg)   LMP 12/14/2015 Comment: 3 months ago -irregular since off birth control  SpO2 99%   BMI 34.80 kg/m    General: Well appearing, NAD, awake, alert, responsive to questions Head: Normocephalic atraumatic CV: Regular rate and rhythm no murmurs rubs or gallops Respiratory: Clear to ausculation bilaterally, no wheezes rales or crackles, chest rises symmetrically,  no increased work of breathing Neuro: CN II: PERRL CN III, IV,VI: EOMI CV V: Normal sensation in V1, V2, V3 CVII: Symmetric smile and brow raise CN VIII: Normal hearing CN IX,X: Symmetric palate raise  CN XI: 5/5 shoulder shrug CN XII: Symmetric tongue protrusion  UE and LE strength 5/5 Normal sensation in UE and LE bilaterally  No ataxia with finger to nose Normal gait  Orthostatics Lying heart rate 79, BP 102/35, SpO2 100% Sitting heart rate 76, BP 132/75, SPO2 99% Standing heart rate  73, BP 113/71, SpO2 99%  ASSESSMENT/PLAN:   Assessment & Plan Lightheadedness Lightheadedness without vertigo, tinnitus, headache, or trauma. Normal neurological exam and orthostatics. Unclear cause given only 1-2 second duration. Does not appear to be emergent issue - Order CBC and BMP - Advise hydration and regular meals. - Provided ED precautions and advise return if symptoms worsen   Requested cholesterol check for cardiovascular monitoring, future order  Jasmine Kidd, MD [facility]

## 2024-01-29 NOTE — Patient Instructions (Addendum)
 It was great to see you! Thank you for allowing me to participate in your care!   Our plans for today:  - Please stay well-hydrated - We will check your labs today - Return to care if having worsening lightheadedness, passing out, weakness in 1 side or headaches  Take care and seek immediate care sooner if you develop any concerns.  Genora Kidd, MD   Fue un placer verte! Gracias por permitirme participar en tu atencin!  Nuestros planes para hoy: - Por favor, mantente bien hidratado. - Revisaremos tus anlisis hoy. - Regresa a tu centro de atencin si presentas mareos, desmayos, debilidad en un lado o dolor de cabeza que empeoran.  Cudate y busca atencin mdica inmediata si tienes alguna inquietud.  Genora Kidd, MD

## 2024-01-30 ENCOUNTER — Ambulatory Visit: Payer: Self-pay | Admitting: Student

## 2024-01-30 LAB — BASIC METABOLIC PANEL WITH GFR
BUN/Creatinine Ratio: 27 — ABNORMAL HIGH (ref 9–23)
BUN: 18 mg/dL (ref 6–24)
CO2: 22 mmol/L (ref 20–29)
Calcium: 9.4 mg/dL (ref 8.7–10.2)
Chloride: 104 mmol/L (ref 96–106)
Creatinine, Ser: 0.66 mg/dL (ref 0.57–1.00)
Glucose: 102 mg/dL — ABNORMAL HIGH (ref 70–99)
Potassium: 4 mmol/L (ref 3.5–5.2)
Sodium: 142 mmol/L (ref 134–144)
eGFR: 104 mL/min/{1.73_m2} (ref >59)

## 2024-01-30 LAB — CBC
Hematocrit: 43.5 % (ref 34.0–46.6)
Hemoglobin: 13.5 g/dL (ref 11.1–15.9)
MCH: 29.3 pg (ref 26.6–33.0)
MCHC: 31 g/dL — ABNORMAL LOW (ref 31.5–35.7)
MCV: 95 fL (ref 79–97)
Platelets: 264 10*3/uL (ref 150–450)
RBC: 4.6 x10E6/uL (ref 3.77–5.28)
RDW: 13.5 % (ref 11.7–15.4)
WBC: 7.1 10*3/uL (ref 3.4–10.8)
